# Patient Record
Sex: Female | Born: 1970 | Race: White | Hispanic: No | Smoking: Never smoker
Health system: Southern US, Community
[De-identification: ages and names within clinical notes are randomized; demographics above are authoritative.]

## PROBLEM LIST (undated history)

## (undated) DIAGNOSIS — F419 Anxiety disorder, unspecified: Secondary | ICD-10-CM

## (undated) DIAGNOSIS — M67853 Other specified disorders of tendon, right hip: Secondary | ICD-10-CM

## (undated) DIAGNOSIS — N183 Chronic kidney disease, stage 3 unspecified: Secondary | ICD-10-CM

## (undated) DIAGNOSIS — M1990 Unspecified osteoarthritis, unspecified site: Secondary | ICD-10-CM

## (undated) DIAGNOSIS — F319 Bipolar disorder, unspecified: Secondary | ICD-10-CM

## (undated) DIAGNOSIS — N289 Disorder of kidney and ureter, unspecified: Secondary | ICD-10-CM

## (undated) DIAGNOSIS — F32A Depression, unspecified: Secondary | ICD-10-CM

## (undated) DIAGNOSIS — C801 Malignant (primary) neoplasm, unspecified: Secondary | ICD-10-CM

## (undated) DIAGNOSIS — G4730 Sleep apnea, unspecified: Secondary | ICD-10-CM

## (undated) DIAGNOSIS — M2550 Pain in unspecified joint: Secondary | ICD-10-CM

## (undated) DIAGNOSIS — G43909 Migraine, unspecified, not intractable, without status migrainosus: Secondary | ICD-10-CM

## (undated) DIAGNOSIS — F329 Major depressive disorder, single episode, unspecified: Secondary | ICD-10-CM

## (undated) DIAGNOSIS — K259 Gastric ulcer, unspecified as acute or chronic, without hemorrhage or perforation: Secondary | ICD-10-CM

## (undated) DIAGNOSIS — D649 Anemia, unspecified: Secondary | ICD-10-CM

## (undated) DIAGNOSIS — K829 Disease of gallbladder, unspecified: Secondary | ICD-10-CM

## (undated) DIAGNOSIS — G250 Essential tremor: Secondary | ICD-10-CM

## (undated) DIAGNOSIS — E559 Vitamin D deficiency, unspecified: Secondary | ICD-10-CM

## (undated) DIAGNOSIS — F429 Obsessive-compulsive disorder, unspecified: Secondary | ICD-10-CM

## (undated) DIAGNOSIS — D569 Thalassemia, unspecified: Secondary | ICD-10-CM

## (undated) DIAGNOSIS — M549 Dorsalgia, unspecified: Secondary | ICD-10-CM

## (undated) HISTORY — DX: Anxiety disorder, unspecified: F41.9

## (undated) HISTORY — DX: Disorder of kidney and ureter, unspecified: N28.9

## (undated) HISTORY — DX: Unspecified osteoarthritis, unspecified site: M19.90

## (undated) HISTORY — DX: Gastric ulcer, unspecified as acute or chronic, without hemorrhage or perforation: K25.9

## (undated) HISTORY — DX: Disease of gallbladder, unspecified: K82.9

## (undated) HISTORY — DX: Essential tremor: G25.0

## (undated) HISTORY — DX: Bipolar disorder, unspecified: F31.9

## (undated) HISTORY — DX: Vitamin D deficiency, unspecified: E55.9

## (undated) HISTORY — DX: Pain in unspecified joint: M25.50

## (undated) HISTORY — DX: Dorsalgia, unspecified: M54.9

## (undated) HISTORY — DX: Other specified disorders of tendon, right hip: M67.853

## (undated) HISTORY — PX: KIDNEY SURGERY: SHX687

## (undated) HISTORY — DX: Chronic kidney disease, stage 3 unspecified: N18.30

## (undated) HISTORY — DX: Obsessive-compulsive disorder, unspecified: F42.9

## (undated) HISTORY — DX: Sleep apnea, unspecified: G47.30

## (undated) HISTORY — PX: OTHER SURGICAL HISTORY: SHX169

## (undated) HISTORY — PX: CHOLECYSTECTOMY: SHX55

## (undated) HISTORY — DX: Anemia, unspecified: D64.9

---

## 1999-05-09 ENCOUNTER — Other Ambulatory Visit: Admission: RE | Admit: 1999-05-09 | Discharge: 1999-05-09 | Payer: Self-pay | Admitting: *Deleted

## 2000-10-18 ENCOUNTER — Other Ambulatory Visit: Admission: RE | Admit: 2000-10-18 | Discharge: 2000-10-18 | Payer: Self-pay | Admitting: *Deleted

## 2004-01-06 ENCOUNTER — Other Ambulatory Visit (HOSPITAL_COMMUNITY): Admission: RE | Admit: 2004-01-06 | Discharge: 2004-01-19 | Payer: Self-pay | Admitting: Psychiatry

## 2005-10-06 ENCOUNTER — Emergency Department: Payer: Self-pay | Admitting: General Practice

## 2005-10-09 ENCOUNTER — Emergency Department: Payer: Self-pay | Admitting: Unknown Physician Specialty

## 2005-10-13 ENCOUNTER — Emergency Department: Payer: Self-pay | Admitting: Emergency Medicine

## 2005-10-20 ENCOUNTER — Emergency Department: Payer: Self-pay | Admitting: Emergency Medicine

## 2005-11-03 ENCOUNTER — Emergency Department: Payer: Self-pay | Admitting: Unknown Physician Specialty

## 2007-10-10 ENCOUNTER — Encounter: Admission: RE | Admit: 2007-10-10 | Discharge: 2007-10-10 | Payer: Self-pay | Admitting: Family Medicine

## 2009-10-14 ENCOUNTER — Encounter
Admission: RE | Admit: 2009-10-14 | Discharge: 2009-12-21 | Payer: Self-pay | Admitting: Physical Medicine & Rehabilitation

## 2009-10-17 ENCOUNTER — Ambulatory Visit: Payer: Self-pay | Admitting: Physical Medicine & Rehabilitation

## 2009-11-03 ENCOUNTER — Ambulatory Visit: Payer: Self-pay | Admitting: Physical Medicine & Rehabilitation

## 2012-11-24 ENCOUNTER — Other Ambulatory Visit: Payer: Self-pay | Admitting: Family Medicine

## 2012-11-24 DIAGNOSIS — E079 Disorder of thyroid, unspecified: Secondary | ICD-10-CM

## 2012-11-25 ENCOUNTER — Ambulatory Visit (INDEPENDENT_AMBULATORY_CARE_PROVIDER_SITE_OTHER): Payer: Managed Care, Other (non HMO)

## 2012-11-25 DIAGNOSIS — E079 Disorder of thyroid, unspecified: Secondary | ICD-10-CM

## 2013-01-14 ENCOUNTER — Other Ambulatory Visit: Payer: Self-pay | Admitting: Family Medicine

## 2013-01-14 DIAGNOSIS — M25519 Pain in unspecified shoulder: Secondary | ICD-10-CM

## 2013-01-14 DIAGNOSIS — R52 Pain, unspecified: Secondary | ICD-10-CM

## 2013-01-20 ENCOUNTER — Ambulatory Visit (INDEPENDENT_AMBULATORY_CARE_PROVIDER_SITE_OTHER): Payer: Managed Care, Other (non HMO)

## 2013-01-20 DIAGNOSIS — M25519 Pain in unspecified shoulder: Secondary | ICD-10-CM

## 2014-02-27 ENCOUNTER — Emergency Department
Admission: EM | Admit: 2014-02-27 | Discharge: 2014-02-27 | Disposition: A | Discharge status: Home / Self Care | Payer: Managed Care, Other (non HMO) | Source: Home / Self Care | Attending: Family Medicine | Admitting: Family Medicine

## 2014-02-27 ENCOUNTER — Encounter: Payer: Self-pay | Admitting: Emergency Medicine

## 2014-02-27 DIAGNOSIS — S0990XA Unspecified injury of head, initial encounter: Secondary | ICD-10-CM

## 2014-02-27 DIAGNOSIS — R112 Nausea with vomiting, unspecified: Secondary | ICD-10-CM

## 2014-02-27 DIAGNOSIS — G43909 Migraine, unspecified, not intractable, without status migrainosus: Secondary | ICD-10-CM

## 2014-02-27 HISTORY — DX: Thalassemia, unspecified: D56.9

## 2014-02-27 HISTORY — DX: Malignant (primary) neoplasm, unspecified: C80.1

## 2014-02-27 HISTORY — DX: Depression, unspecified: F32.A

## 2014-02-27 HISTORY — DX: Major depressive disorder, single episode, unspecified: F32.9

## 2014-02-27 HISTORY — DX: Migraine, unspecified, not intractable, without status migrainosus: G43.909

## 2014-02-27 MED ORDER — ONDANSETRON 4 MG PO TBDP: 4.0000 mg | ORAL_TABLET | Freq: Three times a day (TID) | ORAL | Status: AC | PRN

## 2014-02-27 MED ORDER — KETOROLAC TROMETHAMINE 60 MG/2ML IM SOLN
60.0000 mg | Freq: Once | INTRAMUSCULAR | Status: AC
  Administered 2014-02-27: 60 mg via INTRAMUSCULAR

## 2014-02-27 MED ORDER — ONDANSETRON HCL 4 MG/2ML IJ SOLN
4.0000 mg | Freq: Once | INTRAMUSCULAR | Status: AC
  Administered 2014-02-27: 4 mg via INTRAMUSCULAR

## 2014-02-27 MED ORDER — ISOMETHEPTENE-APAP-DICHLORAL 65-325-100 MG PO CAPS: ORAL_CAPSULE | ORAL | Status: DC

## 2014-02-27 NOTE — ED Notes (Signed)
Reports migraine x 5 days; today has also been nauseated and not tolerating food; has had gingerale. No meds for migraine; lost insurance. Yesterday fell against wall while putting on slacks and bumped right side of head; did not lose consciousness.

## 2014-02-27 NOTE — Discharge Instructions (Signed)
Apply ice pack to right head until swelling resolves.   Head Injury, Adult You have received a head injury. It does not appear serious at this time. Headaches and vomiting are common following head injury. It should be easy to awaken from sleeping. Sometimes it is necessary for you to stay in the emergency department for a while for observation. Sometimes admission to the hospital may be needed. After injuries such as yours, most problems occur within the first 24 hours, but side effects may occur up to 7 10 days after the injury. It is important for you to carefully monitor your condition and contact your health care provider or seek immediate medical care if there is a change in your condition. WHAT ARE THE TYPES OF HEAD INJURIES? Head injuries can be as minor as a bump. Some head injuries can be more severe. More severe head injuries include:  A jarring injury to the brain (concussion).  A bruise of the brain (contusion). This mean there is bleeding in the brain that can cause swelling.  A cracked skull (skull fracture).  Bleeding in the brain that collects, clots, and forms a bump (hematoma). WHAT CAUSES A HEAD INJURY? A serious head injury is most likely to happen to someone who is in a car wreck and is not wearing a seat belt. Other causes of major head injuries include bicycle or motorcycle accidents, sports injuries, and falls. HOW ARE HEAD INJURIES DIAGNOSED? A complete history of the event leading to the injury and your current symptoms will be helpful in diagnosing head injuries. Many times, pictures of the brain, such as CT or MRI are needed to see the extent of the injury. Often, an overnight hospital stay is necessary for observation.  WHEN SHOULD I SEEK IMMEDIATE MEDICAL CARE?  You should get help right away if:  You have confusion or drowsiness.  You feel sick to your stomach (nauseous) or have continued, forceful vomiting.  You have dizziness or unsteadiness that is getting  worse.  You have severe, continued headaches not relieved by medicine. Only take over-the-counter or prescription medicines for pain, fever, or discomfort as directed by your health care provider.  You do not have normal function of the arms or legs or are unable to walk.  You notice changes in the black spots in the center of the colored part of your eye (pupil).  You have a clear or bloody fluid coming from your nose or ears.  You have a loss of vision. During the next 24 hours after the injury, you must stay with someone who can watch you for the warning signs. This person should contact local emergency services (911 in the [place] if you have seizures, you become unconscious, or you are unable to wake up. HOW CAN I PREVENT A HEAD INJURY IN THE FUTURE? The most important factor for preventing major head injuries is avoiding motor vehicle accidents. To minimize the potential for damage to your head, it is crucial to wear seat belts while riding in motor vehicles. Wearing helmets while bike riding and playing collision sports (like football) is also helpful. Also, avoiding dangerous activities around the house will further help reduce your risk of head injury.  WHEN CAN I RETURN TO NORMAL ACTIVITIES AND ATHLETICS? You should be reevaluated by your health care provider before returning to these activities. If you have any of the following symptoms, you should not return to activities or contact sports until 1 week after the symptoms have stopped:  Persistent  headache.  Dizziness or vertigo.  Poor attention and concentration.  Confusion.  Memory problems.  Nausea or vomiting.  Fatigue or tire easily.  Irritability.  Intolerant of bright lights or loud noises.  Anxiety or depression.  Disturbed sleep. MAKE SURE YOU:   Understand these instructions.  Will watch your condition.  Will get help right away if you are not doing well or get worse. Document Released: 12/10/2005  Document Revised: 09/30/2013 Document Reviewed: 08/17/2013 [facility] Patient Information 2014 [place].

## 2014-02-27 NOTE — ED Provider Notes (Signed)
CSN: [id number]     Arrival date & time 02/27/14  1327 History   First MD Initiated Contact with Patient 02/27/14 1342     Chief Complaint  Patient presents with  . Migraine  . Nausea      HPI Comments: Patient has a history of migraine headaches that have responded in the past to Midrin.  Five days ago she developed a typical right-sided headache, but no longer has any Midrin.  Her headache has persisted without neurologic symptoms.  Yesterday evening she developed nausea without vomiting.  Also, she reports that she fell yesterday and bumped the right side of her head.  No loss of consciousness, and no increase in her eadache.  Patient is a 43 y.o. female presenting with migraines. The history is provided by the patient.  Migraine Episode onset: 5 days ago. The problem occurs constantly. The problem has not changed since onset.Nothing aggravates the symptoms. Nothing relieves the symptoms. Treatments tried: Excedrin Migraine. The treatment provided no relief.    Past Medical History  Diagnosis Date  . Depression   . Migraine thalassemia  . Thalassemia   . Cancer     kidney   Past Surgical History  Procedure Laterality Date  . Kidney surgery    . Cholecystectomy     Family History  Problem Relation Age of Onset  . Migraines Mother   . Diabetes Father   . Cancer Father   . Migraines Sister    History  Substance Use Topics  . Smoking status: Never Smoker   . Smokeless tobacco: Not on file  . Alcohol Use: No   OB History   Grav Para Term Preterm Abortions TAB SAB Ect Mult Living                 Review of Systems  Constitutional: Positive for activity change. Negative for fever and chills.  Eyes: Negative for photophobia.  Neurological: Negative for dizziness, tremors, syncope, facial asymmetry, speech difficulty, weakness, light-headedness and numbness.    Allergies  Review of patient's allergies indicates no known allergies.  Home Medications   Current  Outpatient Rx  Name  Route  Sig  Dispense  Refill  . clonazePAM (KLONOPIN) 2 MG tablet   Oral   Take 2 mg by mouth at bedtime.         . lamoTRIgine (LAMICTAL) 100 MG tablet   Oral   Take 100 mg by mouth daily.         Marland Kitchen lithium carbonate (ESKALITH) 450 MG CR tablet   Oral   Take 900 mg by mouth every morning.         . sertraline (ZOLOFT) 100 MG tablet   Oral   Take 100 mg by mouth daily.         Marland Kitchen topiramate (TOPAMAX) 100 MG tablet   Oral   Take 400 mg by mouth daily.         Marland Kitchen isometheptene-acetaminophen-dichloralphenazone (MIDRIN) 65-325-100 MG capsule      Take 2 caps PO at first sign of headache, then 1 Q4hr prn. Maximum 5 capsules in 12 hours for migraine headaches   30 capsule   1   . ondansetron (ZOFRAN ODT) 4 MG disintegrating tablet   Oral   Take 1 tablet (4 mg total) by mouth every 8 (eight) hours as needed for nausea or vomiting.   20 tablet   0    BP 104/70  Pulse 75  Temp(Src) 97.4 F (36.3 C) (Oral)  Resp 16  Ht 5\' 5"  (1.651 m)  Wt 180 lb (81.647 kg)  BMI 29.95 kg/m2  SpO2 100%  LMP 02/16/2014 Physical Exam  Nursing note and vitals reviewed. Constitutional: She is oriented to person, place, and time. She appears well-developed and well-nourished. No distress.  HENT:  Head: Normocephalic.    Right Ear: External ear normal.  Left Ear: External ear normal.  Nose: Nose normal.  Mouth/Throat: Oropharynx is clear and moist.  Right parietal area has a 4cm dia area of tenderness without hematoma as noted on diagam  Eyes: Conjunctivae and EOM are normal. Pupils are equal, round, and reactive to light.  Neck: Normal range of motion. Neck supple.  Cardiovascular: Normal heart sounds.   Pulmonary/Chest: Breath sounds normal.  Abdominal: Bowel sounds are normal. There is no tenderness.  Lymphadenopathy:    She has no cervical adenopathy.  Neurological: She is alert and oriented to person, place, and time. She has normal strength and normal  reflexes. No cranial nerve deficit or sensory deficit. Coordination and gait normal.  Skin: Skin is warm and dry.    ED Course  Procedures  none      MDM   1. Migraine headache   2. Nausea with vomiting   3. Head injury, unspecified    Administered Toradol 60mg  IM and Zofran 4mg  IM.  Rx for Zofran. Apply ice pack to right head until swelling resolves. Discussed head injury precautions If symptoms become significantly worse during the night or over the weekend, proceed to the local emergency room.     Kandra Nicolas, MD 03/01/14 [phone]

## 2014-09-11 ENCOUNTER — Emergency Department
Admission: EM | Admit: 2014-09-11 | Discharge: 2014-09-11 | Disposition: A | Discharge status: Home / Self Care | Payer: 59 | Source: Home / Self Care | Attending: Emergency Medicine | Admitting: Emergency Medicine

## 2014-09-11 ENCOUNTER — Encounter: Payer: Self-pay | Admitting: Emergency Medicine

## 2014-09-11 DIAGNOSIS — Z23 Encounter for immunization: Secondary | ICD-10-CM

## 2014-09-11 DIAGNOSIS — T22219A Burn of second degree of unspecified forearm, initial encounter: Secondary | ICD-10-CM

## 2014-09-11 DIAGNOSIS — T22211A Burn of second degree of right forearm, initial encounter: Secondary | ICD-10-CM

## 2014-09-11 MED ORDER — KETOROLAC TROMETHAMINE 60 MG/2ML IM SOLN
60.0000 mg | Freq: Once | INTRAMUSCULAR | Status: AC
  Administered 2014-09-11: 60 mg via INTRAMUSCULAR

## 2014-09-11 MED ORDER — TETANUS-DIPHTH-ACELL PERTUSSIS 5-2.5-18.5 LF-MCG/0.5 IM SUSP
0.5000 mL | Freq: Once | INTRAMUSCULAR | Status: AC
  Administered 2014-09-11: 0.5 mL via INTRAMUSCULAR

## 2014-09-11 MED ORDER — HYDROCODONE-ACETAMINOPHEN 5-325 MG PO TABS: 1.0000 | ORAL_TABLET | ORAL | Status: DC | PRN
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
## 2014-09-11 NOTE — ED Notes (Signed)
Site cleaned with Hibiclens, applied bacitracin ointment and non-stick bandage.

## 2014-09-11 NOTE — Discharge Instructions (Signed)
Burn Care Your skin is a natural barrier to infection. It is the largest organ of your body. Burns damage this natural protection. To help prevent infection, it is very important to follow your caregiver's instructions in the care of your burn. Burns are classified as:  First degree. There is only redness of the skin (erythema). No scarring is expected.  Second degree. There is blistering of the skin. Scarring may occur with deeper burns.  Third degree. All layers of the skin are injured, and scarring is expected. HOME CARE INSTRUCTIONS   Wash your hands well before changing your bandage.  Change your bandage as often as directed by your caregiver. Daily. Leave open to air after 5 days if site is improving.   Remove the old bandage. If the bandage sticks, you may soak it off with cool, clean water.  Cleanse the burn thoroughly but gently with mild soap and water.  Pat the area dry with a clean, dry cloth.  Apply a thin layer of antibacterial cream to the burn.  Apply a clean bandage as instructed by your caregiver.  Keep the bandage as clean and dry as possible.  Elevate the affected area for the first 24 hours, then as instructed by your caregiver.  Only take over-the-counter or prescription medicines for pain, discomfort, or fever as directed by your caregiver. SEEK IMMEDIATE MEDICAL CARE IF:   You develop excessive pain.  You develop redness, tenderness, swelling, or red streaks near the burn.  The burned area develops yellowish-white fluid (pus) or a bad smell.  You have a fever. MAKE SURE YOU:   Understand these instructions.  Will watch your condition.  Will get help right away if you are not doing well or get worse. Document Released: 12/10/2005 Document Revised: 03/03/2012 Document Reviewed: 05/02/2011 [facility] Patient Information 2015 [place], [place]. This information is not intended to replace advice given to you by your health care provider. Make sure you  discuss any questions you have with your health care provider.
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
## 2014-12-24 HISTORY — PX: ABDOMINAL HYSTERECTOMY: SHX81

## 2016-01-16 ENCOUNTER — Emergency Department
Admission: EM | Admit: 2016-01-16 | Discharge: 2016-01-16 | Disposition: A | Discharge status: Home / Self Care | Payer: Medicare Other | Attending: Emergency Medicine | Admitting: Emergency Medicine

## 2016-01-16 ENCOUNTER — Encounter: Payer: Self-pay | Admitting: Emergency Medicine

## 2016-01-16 DIAGNOSIS — Z79899 Other long term (current) drug therapy: Secondary | ICD-10-CM | POA: Diagnosis not present

## 2016-01-16 DIAGNOSIS — K529 Noninfective gastroenteritis and colitis, unspecified: Secondary | ICD-10-CM | POA: Insufficient documentation

## 2016-01-16 DIAGNOSIS — E860 Dehydration: Secondary | ICD-10-CM | POA: Diagnosis not present

## 2016-01-16 DIAGNOSIS — F419 Anxiety disorder, unspecified: Secondary | ICD-10-CM | POA: Insufficient documentation

## 2016-01-16 DIAGNOSIS — E876 Hypokalemia: Secondary | ICD-10-CM | POA: Diagnosis not present

## 2016-01-16 DIAGNOSIS — R112 Nausea with vomiting, unspecified: Secondary | ICD-10-CM | POA: Diagnosis present

## 2016-01-16 DIAGNOSIS — R51 Headache: Secondary | ICD-10-CM | POA: Diagnosis not present

## 2016-01-16 LAB — COMPREHENSIVE METABOLIC PANEL
ALBUMIN: 3.7 g/dL (ref 3.5–5.0)
ALT: 24 U/L (ref 14–54)
ANION GAP: 9 (ref 5–15)
AST: 18 U/L (ref 15–41)
Alkaline Phosphatase: 140 U/L — ABNORMAL HIGH (ref 38–126)
BUN: 19 mg/dL (ref 6–20)
CHLORIDE: 105 mmol/L (ref 101–111)
CO2: 21 mmol/L — AB (ref 22–32)
Calcium: 8.9 mg/dL (ref 8.9–10.3)
Creatinine, Ser: 1.54 mg/dL — ABNORMAL HIGH (ref 0.44–1.00)
GFR calc Af Amer: 46 mL/min — ABNORMAL LOW (ref >60)
GFR calc non Af Amer: 40 mL/min — ABNORMAL LOW (ref >60)
GLUCOSE: 94 mg/dL (ref 65–99)
POTASSIUM: 2.6 mmol/L — AB (ref 3.5–5.1)
SODIUM: 135 mmol/L (ref 135–145)
Total Bilirubin: 0.3 mg/dL (ref 0.3–1.2)
Total Protein: 7.2 g/dL (ref 6.5–8.1)

## 2016-01-16 LAB — CBC
HEMATOCRIT: 38.4 % (ref 35.0–47.0)
Hemoglobin: 11.9 g/dL — ABNORMAL LOW (ref 12.0–16.0)
MCH: 18.6 pg — ABNORMAL LOW (ref 26.0–34.0)
MCHC: 30.9 g/dL — ABNORMAL LOW (ref 32.0–36.0)
MCV: 60.3 fL — AB (ref 80.0–100.0)
Platelets: 195 10*3/uL (ref 150–440)
RBC: 6.38 MIL/uL — ABNORMAL HIGH (ref 3.80–5.20)
RDW: 18.8 % — AB (ref 11.5–14.5)
WBC: 8 10*3/uL (ref 3.6–11.0)

## 2016-01-16 LAB — URINALYSIS COMPLETE WITH MICROSCOPIC (ARMC ONLY)
BACTERIA UA: NONE SEEN
BILIRUBIN URINE: NEGATIVE
Glucose, UA: NEGATIVE mg/dL
Ketones, ur: NEGATIVE mg/dL
LEUKOCYTES UA: NEGATIVE
Nitrite: NEGATIVE
PH: 6 (ref 5.0–8.0)
PROTEIN: NEGATIVE mg/dL
Specific Gravity, Urine: 1.002 — ABNORMAL LOW (ref 1.005–1.030)

## 2016-01-16 LAB — POTASSIUM: POTASSIUM: 2.6 mmol/L — AB (ref 3.5–5.1)

## 2016-01-16 LAB — LIPASE, BLOOD: Lipase: 21 U/L (ref 11–51)

## 2016-01-16 LAB — TROPONIN I

## 2016-01-16 MED ORDER — ONDANSETRON HCL 4 MG/2ML IJ SOLN
4.0000 mg | Freq: Once | INTRAMUSCULAR | Status: AC
  Administered 2016-01-16: 4 mg via INTRAVENOUS
  Filled 2016-01-16: qty 2

## 2016-01-16 MED ORDER — PROMETHAZINE HCL 12.5 MG PO TABS: 12.5000 mg | ORAL_TABLET | Freq: Four times a day (QID) | ORAL | Status: DC | PRN

## 2016-01-16 MED ORDER — POTASSIUM CHLORIDE 10 MEQ/100ML IV SOLN
10.0000 meq | Freq: Once | INTRAVENOUS | Status: AC
  Administered 2016-01-16: 10 meq via INTRAVENOUS
  Filled 2016-01-16: qty 100

## 2016-01-16 MED ORDER — SODIUM CHLORIDE 0.9 % IV SOLN
1000.0000 mL | Freq: Once | INTRAVENOUS | Status: AC
  Administered 2016-01-16: 1000 mL via INTRAVENOUS

## 2016-01-16 MED ORDER — POTASSIUM CHLORIDE CRYS ER 20 MEQ PO TBCR
40.0000 meq | EXTENDED_RELEASE_TABLET | ORAL | Status: AC
  Administered 2016-01-16: 40 meq via ORAL

## 2016-01-16 MED ORDER — POTASSIUM CHLORIDE CRYS ER 20 MEQ PO TBCR
40.0000 meq | EXTENDED_RELEASE_TABLET | Freq: Two times a day (BID) | ORAL | Status: DC
  Filled 2016-01-16: qty 2

## 2016-01-16 NOTE — Discharge Instructions (Signed)

## 2016-01-16 NOTE — ED Notes (Signed)
Pt a/o. Hr 89 on recheck. All other vs wnl. Bilateral abd pain. Nausea.

## 2016-01-16 NOTE — ED Notes (Signed)
Lab called with potassium of 2.6  Dr Corky Downs aware.

## 2016-01-16 NOTE — ED Notes (Signed)
Pt reports upper abdominal pain, mid back pain, n/v/d since Saturday.

## 2016-01-16 NOTE — ED Provider Notes (Signed)
[facility] Emergency Department Provider Note  ____________________________________________    I have reviewed the triage vital signs and the nursing notes.   HISTORY  Chief Complaint Abnormal Lab; Abdominal Pain; and Back Pain    HPI Natasha Kim is a 45 y.o. female who presents with nausea vomiting diarrhea for approximately 2 days. She does complain of mild to moderate diffuse abdominal cramping as well particularly when vomiting. She went to urgent care today and was referred to the ED for elevated creatinine and orthostatics and the need for IV fluids. She denies fevers but has had chills. No sick contacts. No recent travel.     Past Medical History  Diagnosis Date  . Depression   . Migraine thalassemia  . Thalassemia   . Cancer ([place])     kidney    There are no active problems to display for this patient.   Past Surgical History  Procedure Laterality Date  . Kidney surgery    . Cholecystectomy    . Partial kidney resection-left    . Abdominal hysterectomy  2016    Current Outpatient Rx  Name  Route  Sig  Dispense  Refill  . clonazePAM (KLONOPIN) 2 MG tablet   Oral   Take 2 mg by mouth at bedtime.         Marland Kitchen HYDROcodone-acetaminophen (NORCO/VICODIN) 5-325 MG per tablet   Oral   Take 1-2 tablets by mouth every 4 (four) hours as needed for severe pain. Take with food.   12 tablet   0   . isometheptene-acetaminophen-dichloralphenazone (MIDRIN) 65-325-100 MG capsule      Take 2 caps PO at first sign of headache, then 1 Q4hr prn. Maximum 5 capsules in 12 hours for migraine headaches   30 capsule   1   . lamoTRIgine (LAMICTAL) 100 MG tablet   Oral   Take 100 mg by mouth daily.         Marland Kitchen lithium carbonate (ESKALITH) 450 MG CR tablet   Oral   Take 900 mg by mouth every morning.         . ondansetron (ZOFRAN ODT) 4 MG disintegrating tablet   Oral   Take 1 tablet (4 mg total) by mouth every 8 (eight) hours as needed for  nausea or vomiting.   20 tablet   0   . sertraline (ZOLOFT) 100 MG tablet   Oral   Take 100 mg by mouth daily.         Marland Kitchen topiramate (TOPAMAX) 100 MG tablet   Oral   Take 400 mg by mouth daily.           Allergies Review of patient's allergies indicates no known allergies.  Family History  Problem Relation Age of Onset  . Migraines Mother   . Diabetes Father   . Cancer Father   . Migraines Sister     Social History Social History  Substance Use Topics  . Smoking status: Never Smoker   . Smokeless tobacco: Never Used  . Alcohol Use: No    Review of Systems  Constitutional: Negative for fever. Positive for chills Eyes: Negative for visual changes. ENT: Negative for sore throat Cardiovascular: Negative for chest pain. Respiratory: Negative for shortness of breath. Gastrointestinal: As above Genitourinary: Negative for dysuria. Musculoskeletal: Negative for back pain. Skin: Negative for rash. Neurological: Positive for mild headache Psychiatric: Positive for anxiety    ____________________________________________   PHYSICAL EXAM:  VITAL SIGNS: ED Triage Vitals  Enc Vitals  Group     BP 01/16/16 1653 118/89 mmHg     Pulse Rate 01/16/16 1653 110     Resp 01/16/16 1653 16     Temp 01/16/16 1653 98.7 F (37.1 C)     Temp Source 01/16/16 1653 Oral     SpO2 01/16/16 1653 100 %     Weight 01/16/16 1653 210 lb (95.255 kg)     Height 01/16/16 1653 5\' 5"  (1.651 m)     Head Cir --      Peak Flow --      Pain Score 01/16/16 1653 8     Pain Loc --      Pain Edu? --      Excl. in [place]? --      Constitutional: Alert and oriented. Well appearing and in no distress. Eyes: Conjunctivae are normal.  ENT   Head: Normocephalic and atraumatic.   Mouth/Throat: Mucous membranes are moist. Cardiovascular: Normal rate, regular rhythm. Normal and symmetric distal pulses are present in all extremities. No murmurs, rubs, or gallops. Respiratory: Normal  respiratory effort without tachypnea nor retractions. Breath sounds are clear and equal bilaterally.  Gastrointestinal: Soft and non-tender in all quadrants. No distention. There is no CVA tenderness. Genitourinary: deferred Musculoskeletal: Nontender with normal range of motion in all extremities. No lower extremity tenderness nor edema. Neurologic:  Normal speech and language. No gross focal neurologic deficits are appreciated. Skin:  Skin is warm, dry and intact. No rash noted. Psychiatric: Mood and affect are normal. Patient exhibits appropriate insight and judgment.  ____________________________________________    LABS (pertinent positives/negatives)  Labs Reviewed  COMPREHENSIVE METABOLIC PANEL - Abnormal; Notable for the following:    Potassium 2.6 (*)    CO2 21 (*)    Creatinine, Ser 1.54 (*)    Alkaline Phosphatase 140 (*)    GFR calc non Af Amer 40 (*)    GFR calc Af Amer 46 (*)    All other components within normal limits  CBC - Abnormal; Notable for the following:    RBC 6.38 (*)    Hemoglobin 11.9 (*)    MCV 60.3 (*)    MCH 18.6 (*)    MCHC 30.9 (*)    RDW 18.8 (*)    All other components within normal limits  URINALYSIS COMPLETEWITH MICROSCOPIC ([facility] ONLY) - Abnormal; Notable for the following:    Color, Urine STRAW (*)    APPearance CLEAR (*)    Specific Gravity, Urine 1.002 (*)    Hgb urine dipstick 1+ (*)    Squamous Epithelial / LPF 6-30 (*)    All other components within normal limits  LIPASE, BLOOD  TROPONIN I  POTASSIUM    ____________________________________________   EKG  ED ECG REPORT I, Lavonia Drafts, the attending physician, personally viewed and interpreted this ECG.  Date: 01/16/2016 EKG Time: 4:55 PM Rate: 99 Rhythm: normal sinus rhythm QRS Axis: normal Intervals: normal ST/T Wave abnormalities: Nonspecific Conduction Disutrbances: none    ____________________________________________    RADIOLOGY I have personally  reviewed any xrays that were ordered on this patient: None  ____________________________________________   PROCEDURES  Procedure(s) performed: none  Critical Care performed: none  ____________________________________________   INITIAL IMPRESSION / ASSESSMENT AND PLAN / ED COURSE  Pertinent labs & imaging results that were available during my care of the patient were reviewed by me and considered in my medical decision making (see chart for details).  Patient presents with nausea vomiting and diarrhea likely related to the  viral gastroenteritis that is rampant in the community at this time. We will give IV fluids and IV Zofran and reevaluate   Patient's potassium is noted to be at 2.6 here. Her potassium was 3.1 only a few hours ago at the outside facility. We will recheck. Recheck potassium is 2.6. Gave 10 of IV potassium and 40 mEq by mouth. Patient has had 2 L of normal saline in the emergency department.  She feels significantly better. I offered admission but the patient strongly prefers to go home. I think this is reasonable given she has ability to have her potassium rechecked by her physician tomorrow   ____________________________________________   FINAL CLINICAL IMPRESSION(S) / ED DIAGNOSES  Final diagnoses:  Gastroenteritis  Dehydration  Hypokalemia     Lavonia Drafts, MD 01/16/16 2132

## 2017-05-01 ENCOUNTER — Emergency Department: Payer: Medicare Other

## 2017-05-01 ENCOUNTER — Observation Stay
Admission: EM | Admit: 2017-05-01 | Discharge: 2017-05-03 | Disposition: A | Discharge status: Home / Self Care | Payer: Medicare Other | Attending: Specialist | Admitting: Specialist

## 2017-05-01 ENCOUNTER — Encounter: Payer: Self-pay | Admitting: *Deleted

## 2017-05-01 DIAGNOSIS — Z85528 Personal history of other malignant neoplasm of kidney: Secondary | ICD-10-CM | POA: Insufficient documentation

## 2017-05-01 DIAGNOSIS — A084 Viral intestinal infection, unspecified: Principal | ICD-10-CM | POA: Insufficient documentation

## 2017-05-01 DIAGNOSIS — D569 Thalassemia, unspecified: Secondary | ICD-10-CM | POA: Insufficient documentation

## 2017-05-01 DIAGNOSIS — E872 Acidosis: Secondary | ICD-10-CM | POA: Diagnosis not present

## 2017-05-01 DIAGNOSIS — Z79899 Other long term (current) drug therapy: Secondary | ICD-10-CM | POA: Insufficient documentation

## 2017-05-01 DIAGNOSIS — D509 Iron deficiency anemia, unspecified: Secondary | ICD-10-CM | POA: Insufficient documentation

## 2017-05-01 DIAGNOSIS — R112 Nausea with vomiting, unspecified: Secondary | ICD-10-CM | POA: Diagnosis present

## 2017-05-01 DIAGNOSIS — R1084 Generalized abdominal pain: Secondary | ICD-10-CM

## 2017-05-01 DIAGNOSIS — F329 Major depressive disorder, single episode, unspecified: Secondary | ICD-10-CM | POA: Diagnosis not present

## 2017-05-01 DIAGNOSIS — Z79890 Hormone replacement therapy (postmenopausal): Secondary | ICD-10-CM | POA: Insufficient documentation

## 2017-05-01 DIAGNOSIS — G43909 Migraine, unspecified, not intractable, without status migrainosus: Secondary | ICD-10-CM | POA: Diagnosis not present

## 2017-05-01 DIAGNOSIS — K625 Hemorrhage of anus and rectum: Secondary | ICD-10-CM | POA: Diagnosis not present

## 2017-05-01 LAB — URINALYSIS, COMPLETE (UACMP) WITH MICROSCOPIC
BILIRUBIN URINE: NEGATIVE
Bacteria, UA: NONE SEEN
GLUCOSE, UA: NEGATIVE mg/dL
KETONES UR: NEGATIVE mg/dL
LEUKOCYTES UA: NEGATIVE
NITRITE: NEGATIVE
PH: 7 (ref 5.0–8.0)
Protein, ur: NEGATIVE mg/dL
Specific Gravity, Urine: 1.004 — ABNORMAL LOW (ref 1.005–1.030)
WBC, UA: NONE SEEN WBC/hpf (ref 0–5)

## 2017-05-01 LAB — COMPREHENSIVE METABOLIC PANEL
ALK PHOS: 131 U/L — AB (ref 38–126)
ALT: 30 U/L (ref 14–54)
AST: 26 U/L (ref 15–41)
Albumin: 4.1 g/dL (ref 3.5–5.0)
Anion gap: 8 (ref 5–15)
BUN: 11 mg/dL (ref 6–20)
CALCIUM: 9.1 mg/dL (ref 8.9–10.3)
CO2: 18 mmol/L — ABNORMAL LOW (ref 22–32)
CREATININE: 1.11 mg/dL — AB (ref 0.44–1.00)
Chloride: 115 mmol/L — ABNORMAL HIGH (ref 101–111)
GFR, EST NON AFRICAN AMERICAN: 59 mL/min — AB (ref >60)
Glucose, Bld: 150 mg/dL — ABNORMAL HIGH (ref 65–99)
Potassium: 3.7 mmol/L (ref 3.5–5.1)
Sodium: 141 mmol/L (ref 135–145)
TOTAL PROTEIN: 7.8 g/dL (ref 6.5–8.1)
Total Bilirubin: 0.5 mg/dL (ref 0.3–1.2)

## 2017-05-01 LAB — CBC
HCT: 35.7 % (ref 35.0–47.0)
HCT: 33.2 % — ABNORMAL LOW (ref 35.0–47.0)
Hemoglobin: 11 g/dL — ABNORMAL LOW (ref 12.0–16.0)
Hemoglobin: 10.3 g/dL — ABNORMAL LOW (ref 12.0–16.0)
MCH: 18.1 pg — ABNORMAL LOW (ref 26.0–34.0)
MCH: 18.2 pg — AB (ref 26.0–34.0)
MCHC: 30.7 g/dL — ABNORMAL LOW (ref 32.0–36.0)
MCHC: 30.9 g/dL — ABNORMAL LOW (ref 32.0–36.0)
MCV: 58.8 fL — ABNORMAL LOW (ref 80.0–100.0)
MCV: 58.9 fL — ABNORMAL LOW (ref 80.0–100.0)
PLATELETS: 293 10*3/uL (ref 150–440)
Platelets: 332 10*3/uL (ref 150–440)
RBC: 6.07 MIL/uL — AB (ref 3.80–5.20)
RBC: 5.63 MIL/uL — AB (ref 3.80–5.20)
RDW: 19.1 % — ABNORMAL HIGH (ref 11.5–14.5)
RDW: 18.9 % — ABNORMAL HIGH (ref 11.5–14.5)
WBC: 24.1 10*3/uL — AB (ref 3.6–11.0)
WBC: 21.7 10*3/uL — ABNORMAL HIGH (ref 3.6–11.0)

## 2017-05-01 LAB — CBC WITH DIFFERENTIAL/PLATELET
Basophils Absolute: 0 10*3/uL (ref 0–0.1)
Basophils Relative: 0 %
Eosinophils Absolute: 0 10*3/uL (ref 0–0.7)
Eosinophils Relative: 0 %
HEMATOCRIT: 33.2 % — AB (ref 35.0–47.0)
HEMOGLOBIN: 10.2 g/dL — AB (ref 12.0–16.0)
Lymphocytes Relative: 10 %
Lymphs Abs: 2 10*3/uL (ref 1.0–3.6)
MCH: 18.1 pg — ABNORMAL LOW (ref 26.0–34.0)
MCHC: 30.7 g/dL — ABNORMAL LOW (ref 32.0–36.0)
MCV: 58.9 fL — AB (ref 80.0–100.0)
MONO ABS: 1.3 10*3/uL — AB (ref 0.2–0.9)
MONOS PCT: 7 %
NEUTROS ABS: 16.7 10*3/uL — AB (ref 1.4–6.5)
Neutrophils Relative %: 83 %
Platelets: 279 10*3/uL (ref 150–440)
RBC: 5.65 MIL/uL — ABNORMAL HIGH (ref 3.80–5.20)
RDW: 19.2 % — AB (ref 11.5–14.5)
WBC: 20 10*3/uL — ABNORMAL HIGH (ref 3.6–11.0)

## 2017-05-01 LAB — FERRITIN: FERRITIN: 8 ng/mL — AB (ref 11–307)

## 2017-05-01 LAB — TSH: TSH: 3.221 u[IU]/mL (ref 0.350–4.500)

## 2017-05-01 LAB — LIPASE, BLOOD: Lipase: 19 U/L (ref 11–51)

## 2017-05-01 LAB — CREATININE, SERUM
CREATININE: 1.14 mg/dL — AB (ref 0.44–1.00)
GFR calc Af Amer: >60 mL/min (ref >60)
GFR calc non Af Amer: 57 mL/min — ABNORMAL LOW (ref >60)

## 2017-05-01 LAB — IRON AND TIBC
Iron: 52 ug/dL (ref 28–170)
Saturation Ratios: 11 % (ref 10.4–31.8)
TIBC: 465 ug/dL — ABNORMAL HIGH (ref 250–450)
UIBC: 413 ug/dL

## 2017-05-01 LAB — FOLATE: FOLATE: 12 ng/mL (ref >5.9)

## 2017-05-01 LAB — VITAMIN B12: VITAMIN B 12: 585 pg/mL (ref 180–914)

## 2017-05-01 LAB — LITHIUM LEVEL: Lithium Lvl: 0.21 mmol/L — ABNORMAL LOW (ref 0.60–1.20)

## 2017-05-01 MED ORDER — MORPHINE SULFATE (PF) 4 MG/ML IV SOLN
4.0000 mg | Freq: Once | INTRAVENOUS | Status: AC
  Administered 2017-05-01: 4 mg via INTRAVENOUS
  Filled 2017-05-01: qty 1

## 2017-05-01 MED ORDER — ZOLPIDEM TARTRATE 5 MG PO TABS
5.0000 mg | ORAL_TABLET | Freq: Every day | ORAL | Status: DC
  Administered 2017-05-01 – 2017-05-02 (×2): 5 mg via ORAL
  Filled 2017-05-01 (×2): qty 1

## 2017-05-01 MED ORDER — ONDANSETRON 4 MG PO TBDP
4.0000 mg | ORAL_TABLET | Freq: Once | ORAL | Status: AC | PRN
  Administered 2017-05-01: 4 mg via ORAL

## 2017-05-01 MED ORDER — LITHIUM CARBONATE ER 450 MG PO TBCR
900.0000 mg | EXTENDED_RELEASE_TABLET | Freq: Every morning | ORAL | Status: DC
  Filled 2017-05-01: qty 2

## 2017-05-01 MED ORDER — LITHIUM CARBONATE ER 300 MG PO TBCR
600.0000 mg | EXTENDED_RELEASE_TABLET | Freq: Every evening | ORAL | Status: DC
  Administered 2017-05-01 – 2017-05-02 (×2): 600 mg via ORAL
  Filled 2017-05-01 (×3): qty 2

## 2017-05-01 MED ORDER — ENOXAPARIN SODIUM 40 MG/0.4ML ~~LOC~~ SOLN
40.0000 mg | SUBCUTANEOUS | Status: DC
  Administered 2017-05-01 – 2017-05-02 (×2): 40 mg via SUBCUTANEOUS
  Filled 2017-05-01 (×2): qty 0.4

## 2017-05-01 MED ORDER — ACETAMINOPHEN 650 MG RE SUPP: 650.0000 mg | Freq: Four times a day (QID) | RECTAL | Status: DC | PRN

## 2017-05-01 MED ORDER — ONDANSETRON 4 MG PO TBDP
ORAL_TABLET | ORAL | Status: AC
  Filled 2017-05-01: qty 1

## 2017-05-01 MED ORDER — LAMOTRIGINE 100 MG PO TABS
200.0000 mg | ORAL_TABLET | Freq: Every day | ORAL | Status: DC
  Administered 2017-05-01 – 2017-05-03 (×3): 200 mg via ORAL
  Filled 2017-05-01 (×3): qty 2

## 2017-05-01 MED ORDER — DICYCLOMINE HCL 20 MG PO TABS
20.0000 mg | ORAL_TABLET | Freq: Once | ORAL | Status: AC
  Administered 2017-05-01: 20 mg via ORAL
  Filled 2017-05-01: qty 1

## 2017-05-01 MED ORDER — PROMETHAZINE HCL 25 MG PO TABS
12.5000 mg | ORAL_TABLET | Freq: Four times a day (QID) | ORAL | Status: DC | PRN
  Administered 2017-05-01: 12.5 mg via ORAL
  Filled 2017-05-01: qty 1

## 2017-05-01 MED ORDER — ONDANSETRON 4 MG PO TBDP
4.0000 mg | ORAL_TABLET | Freq: Three times a day (TID) | ORAL | Status: DC | PRN
  Administered 2017-05-01: 4 mg via ORAL
  Filled 2017-05-01: qty 1

## 2017-05-01 MED ORDER — HYDROCODONE-ACETAMINOPHEN 5-325 MG PO TABS
1.0000 | ORAL_TABLET | ORAL | Status: DC | PRN
  Administered 2017-05-01 – 2017-05-03 (×9): 2 via ORAL
  Filled 2017-05-01 (×9): qty 2

## 2017-05-01 MED ORDER — SODIUM CHLORIDE 0.9 % IV SOLN
Freq: Once | INTRAVENOUS | Status: AC
  Administered 2017-05-01: 11:00:00 via INTRAVENOUS

## 2017-05-01 MED ORDER — SODIUM CHLORIDE 0.9 % IV SOLN
INTRAVENOUS | Status: DC
  Administered 2017-05-01 – 2017-05-03 (×3): via INTRAVENOUS

## 2017-05-01 MED ORDER — TOPIRAMATE 100 MG PO TABS
400.0000 mg | ORAL_TABLET | Freq: Every day | ORAL | Status: DC
  Administered 2017-05-01 – 2017-05-03 (×3): 400 mg via ORAL
  Filled 2017-05-01 (×3): qty 4

## 2017-05-01 MED ORDER — CLONAZEPAM 1 MG PO TABS
2.0000 mg | ORAL_TABLET | Freq: Every day | ORAL | Status: DC
  Administered 2017-05-01 – 2017-05-02 (×2): 2 mg via ORAL
  Filled 2017-05-01 (×2): qty 2

## 2017-05-01 MED ORDER — CARIPRAZINE HCL 1.5 MG PO CAPS
1.5000 mg | ORAL_CAPSULE | Freq: Every day | ORAL | Status: DC
  Administered 2017-05-02 (×2): 1.5 mg via ORAL
  Filled 2017-05-01 (×2): qty 1

## 2017-05-01 MED ORDER — HYDROCODONE-ACETAMINOPHEN 5-325 MG PO TABS
ORAL_TABLET | ORAL | Status: AC
  Administered 2017-05-01: 2 via ORAL
  Filled 2017-05-01: qty 2

## 2017-05-01 MED ORDER — ONDANSETRON HCL 4 MG PO TABS
4.0000 mg | ORAL_TABLET | Freq: Four times a day (QID) | ORAL | Status: DC | PRN
  Filled 2017-05-01: qty 1

## 2017-05-01 MED ORDER — ACETAMINOPHEN 325 MG PO TABS: 650.0000 mg | ORAL_TABLET | Freq: Four times a day (QID) | ORAL | Status: DC | PRN

## 2017-05-01 MED ORDER — PANTOPRAZOLE SODIUM 40 MG IV SOLR
40.0000 mg | Freq: Two times a day (BID) | INTRAVENOUS | Status: DC
  Administered 2017-05-01 – 2017-05-02 (×3): 40 mg via INTRAVENOUS
  Filled 2017-05-01 (×3): qty 40

## 2017-05-01 MED ORDER — ONDANSETRON HCL 4 MG/2ML IJ SOLN: 4.0000 mg | Freq: Four times a day (QID) | INTRAMUSCULAR | Status: DC | PRN

## 2017-05-01 MED ORDER — IOPAMIDOL (ISOVUE-300) INJECTION 61%
100.0000 mL | Freq: Once | INTRAVENOUS | Status: AC | PRN
  Administered 2017-05-01: 100 mL via INTRAVENOUS

## 2017-05-01 MED ORDER — SERTRALINE HCL 100 MG PO TABS
100.0000 mg | ORAL_TABLET | Freq: Every day | ORAL | Status: DC
  Administered 2017-05-01 – 2017-05-03 (×3): 100 mg via ORAL
  Filled 2017-05-01 (×3): qty 1

## 2017-05-01 MED ORDER — ONDANSETRON HCL 4 MG/2ML IJ SOLN
4.0000 mg | Freq: Once | INTRAMUSCULAR | Status: AC
  Administered 2017-05-01: 4 mg via INTRAVENOUS
  Filled 2017-05-01: qty 2

## 2017-05-01 NOTE — Progress Notes (Signed)
Dr. Ara Kussmaul notified of patient's proper dose of Lithium Carbonate as at home (600 mg vs. 900 mg); also that patient's family brought in her home med of Vraylar 1.5 mg that she states she takes every bedtime for Bipolar; Acknowledged; new orders written. Barbaraann Faster, RN 11:20 PM 05/01/2017

## 2017-05-01 NOTE — ED Notes (Signed)
Patient transported to CT 

## 2017-05-01 NOTE — ED Triage Notes (Signed)
States nausea that began last night with 6 episodes of vomiting and lower abd pain, denies any problems with urination, awake and alert in no acute distress

## 2017-05-01 NOTE — ED Provider Notes (Signed)
[facility] Emergency Department Provider Note       Time seen: ----------------------------------------- 10:33 AM on 05/01/2017 -----------------------------------------     I have reviewed the triage vital signs and the nursing notes.   HISTORY   Chief Complaint Emesis    HPI Natasha Kim is a 46 y.o. female who presents to the ED for nausea that began last night with 6 episodes of vomiting and lower abdominal pain diffusely. She is also having low back pain. She denies any trouble urinating. She denies fevers, chills or other complaints. This is not been a frequent problem in the past.    Past Medical History:  Diagnosis Date  . Cancer ([place])    kidney  . Depression   . Migraine thalassemia  . Thalassemia     There are no active problems to display for this patient.   Past Surgical History:  Procedure Laterality Date  . ABDOMINAL HYSTERECTOMY  2016  . CHOLECYSTECTOMY    . KIDNEY SURGERY    . partial kidney resection-left      Allergies Patient has no known allergies.  Social History Social History  Substance Use Topics  . Smoking status: Never Smoker  . Smokeless tobacco: Never Used  . Alcohol use No    Review of Systems Constitutional: Negative for fever. Eyes: Negative for vision changes ENT:  Negative for congestion, sore throat Cardiovascular: Negative for chest pain. Respiratory: Negative for shortness of breath. Gastrointestinal: Positive for abdominal pain, vomiting Genitourinary: Negative for dysuria. Musculoskeletal: Positive for low back pain Skin: Negative for rash. Neurological: Negative for headaches, focal weakness or numbness.  All systems negative/normal/unremarkable except as stated in the HPI  ____________________________________________   PHYSICAL EXAM:  VITAL SIGNS: ED Triage Vitals  Enc Vitals Group     BP 05/01/17 0807 (!) 140/96     Pulse Rate 05/01/17 0807 (!) 105     Resp 05/01/17  0807 16     Temp 05/01/17 0807 99.6 F (37.6 C)     Temp Source 05/01/17 0807 Oral     SpO2 05/01/17 0807 95 %     Weight 05/01/17 0807 240 lb (108.9 kg)     Height 05/01/17 0807 5\' 5"  (1.651 m)     Head Circumference --      Peak Flow --      Pain Score 05/01/17 0806 10     Pain Loc --      Pain Edu? --      Excl. in [place]? --     Constitutional: Alert and oriented. Mild distress Eyes: Conjunctivae are normal. PERRL. Normal extraocular movements. ENT   Head: Normocephalic and atraumatic.   Nose: No congestion/rhinnorhea.   Mouth/Throat: Mucous membranes are moist.   Neck: No stridor. Cardiovascular: Normal rate, regular rhythm. No murmurs, rubs, or gallops. Respiratory: Normal respiratory effort without tachypnea nor retractions. Breath sounds are clear and equal bilaterally. No wheezes/rales/rhonchi. Gastrointestinal: Soft and nontender. Hyperactive bowel sounds Musculoskeletal: Nontender with normal range of motion in extremities. No lower extremity tenderness nor edema. Neurologic:  Normal speech and language. No gross focal neurologic deficits are appreciated.  Skin:  Skin is warm, dry and intact. No rash noted. Psychiatric: Mood and affect are normal. Speech and behavior are normal.  ____________________________________________  ED COURSE:  Pertinent labs & imaging results that were available during my care of the patient were reviewed by me and considered in my medical decision making (see chart for details). Patient presents for abdominal pain and  vomiting, we will assess with labs and imaging as indicated.   Procedures ____________________________________________   LABS (pertinent positives/negatives)  Labs Reviewed  COMPREHENSIVE METABOLIC PANEL - Abnormal; Notable for the following:       Result Value   Chloride 115 (*)    CO2 18 (*)    Glucose, Bld 150 (*)    Creatinine, Ser 1.11 (*)    Alkaline Phosphatase 131 (*)    GFR calc non Af Amer 59 (*)     All other components within normal limits  CBC - Abnormal; Notable for the following:    WBC 24.1 (*)    RBC 6.07 (*)    Hemoglobin 11.0 (*)    MCV 58.8 (*)    MCH 18.1 (*)    MCHC 30.7 (*)    RDW 19.1 (*)    All other components within normal limits  URINALYSIS, COMPLETE (UACMP) WITH MICROSCOPIC - Abnormal; Notable for the following:    Color, Urine STRAW (*)    APPearance CLEAR (*)    Specific Gravity, Urine 1.004 (*)    Hgb urine dipstick SMALL (*)    Squamous Epithelial / LPF 0-5 (*)    All other components within normal limits  LIPASE, BLOOD    RADIOLOGY Images were viewed by me  CT abdomen and pelvis with contrast IMPRESSION: A cause for patient's symptoms has not been established with this study.  No renal or ureteral calculus. No hydronephrosis evident. Appendix region appears normal. No bowel obstruction or abscess.  Small hiatal hernia. Small ventral hernia containing only fat.  Gallbladder absent. Uterus absent. ____________________________________________  FINAL ASSESSMENT AND PLAN  Abdominal pain,Vomiting, leukocytosis  Plan: Patient's labs and imaging were dictated above. Patient had presented for Abdominal pain and vomiting of uncertain etiology. Her white blood cell count has improved some but not significantly. She still has abdominal cramping and nausea with seemingly intractable vomiting. CT scan was reassuring but I will discuss with the hospitalist for observation.   Earleen Newport, MD   Note: This note was generated in part or whole with voice recognition software. Voice recognition is usually quite accurate but there are transcription errors that can and very often do occur. I apologize for any typographical errors that were not detected and corrected.     Earleen Newport, MD 05/01/17 1345

## 2017-05-01 NOTE — ED Notes (Signed)
Pt moved to subwait so she can lay back.

## 2017-05-01 NOTE — ED Notes (Signed)
PO challenge success.  Patient tolerating fluids well at this time.

## 2017-05-01 NOTE — Consult Note (Signed)
[name] MD  [address] [place], [place] [address] Phone: [phone] Fax : [phone]  Consultation  Referring Provider:    Dr Posey Pronto  Primary Care Physician:  Punger, Barron Schmid, MD Primary Gastroenterologist:  None  Reason for Consultation:     Vomiting   Date of Admission:  05/01/2017 Date of Consultation:  05/01/2017         HPI:   Natasha Kim is a 46 y.o. female presented to the Er today with nausea and vomiting since last night . Some fevers but no diarrhea.  She says she was doing well till last evening when all of a sudden felt a rumble in her abdomen had to pull over while driving , had a bowel movement went home and subsequently threw up > 15 times, no diarrhea, felt she had the chills but no fever, since this am just had 2 episodes of emesis, able to keep down liquids now . No abdominal pains. She carries a diagnosis of thalassemia.    Past Medical History:  Diagnosis Date  . Cancer ([place])    kidney  . Depression   . Migraine thalassemia  . Thalassemia     Past Surgical History:  Procedure Laterality Date  . ABDOMINAL HYSTERECTOMY  2016  . CHOLECYSTECTOMY    . KIDNEY SURGERY    . partial kidney resection-left      Prior to Admission medications   Medication Sig Start Date End Date Taking? Authorizing Provider  benztropine (COGENTIN) 0.5 MG tablet Take 0.5 mg by mouth every 12 (twelve) hours.   Yes [provider]  clonazePAM (KLONOPIN) 0.5 MG tablet Take 0.5-1 mg by mouth every 8 (eight) hours as needed.    Yes [provider]  diclofenac (VOLTAREN) 75 MG EC tablet Take 75 mg by mouth daily. Daily, after lunch   Yes [provider]  estradiol (ESTRACE) 1 MG tablet Take 1 mg by mouth daily.   Yes [provider]  lamoTRIgine (LAMICTAL) 100 MG tablet Take 200 mg by mouth daily.    Yes [provider]  lithium carbonate (LITHOBID) 300 MG CR tablet Take 600 mg by mouth at bedtime.    Yes [provider]    ondansetron (ZOFRAN ODT) 4 MG disintegrating tablet Take 1 tablet (4 mg total) by mouth every 8 (eight) hours as needed for nausea or vomiting. 02/27/14  Yes Kandra Nicolas, MD  sertraline (ZOLOFT) 100 MG tablet Take 100 mg by mouth daily.   Yes [provider]  SUMAtriptan (IMITREX) 50 MG tablet Take 50 mg by mouth every 2 (two) hours as needed for migraine. May repeat in 2 hours if headache persists or recurs.   Yes [provider]  topiramate (TOPAMAX) 200 MG tablet Take 400 mg by mouth at bedtime.   Yes [provider]  zolpidem (AMBIEN) 5 MG tablet Take 10 mg by mouth at bedtime. 02/26/17  Yes [provider]    Family History  Problem Relation Age of Onset  . Migraines Mother   . Diabetes Father   . Cancer Father   . Migraines Sister      Social History  Substance Use Topics  . Smoking status: Never Smoker  . Smokeless tobacco: Never Used  . Alcohol use No    Allergies as of 05/01/2017  . (No Known Allergies)    Review of Systems:    All systems reviewed and negative except where noted in HPI.   Physical Exam:  Vital  signs in last 24 hours: Temp:  [97.6 F (36.4 C)-99.6 F (37.6 C)] 97.6 F (36.4 C) (05/09 1555) Pulse Rate:  [70-105] 86 (05/09 1555) Resp:  [10-20] 20 (05/09 1555) BP: (115-140)/(68-109) 126/81 (05/09 1555) SpO2:  [95 %-99 %] 97 % (05/09 1555) Weight:  [240 lb (108.9 kg)] 240 lb (108.9 kg) (05/09 0807)   General:   Pleasant, cooperative in NAD Head:  Normocephalic and atraumatic. Eyes:   No icterus.   Conjunctiva pink. PERRLA. Ears:  Normal auditory acuity. Neck:  Supple; no masses or thyroidomegaly Lungs: Respirations even and unlabored. Lungs clear to auscultation bilaterally.   No wheezes, crackles, or rhonchi.  Heart:  Regular rate and rhythm;  Without murmur, clicks, rubs or gallops Abdomen:  Soft, nondistended, nontender. Normal bowel sounds. No appreciable masses or hepatomegaly.  No rebound or guarding.   Rectal:  Not performed. Neurologic:  Alert and oriented x3;  grossly normal neurologically. Skin:  Intact without significant lesions or rashes. Cervical Nodes:  No significant cervical adenopathy. Psych:  Alert and cooperative. Normal affect.  LAB RESULTS:  Recent Labs  05/01/17 0806 05/01/17 1247 05/01/17 1545  WBC 24.1* 20.0* 21.7*  HGB 11.0* 10.2* 10.3*  HCT 35.7 33.2* 33.2*  PLT 332 279 293   BMET  Recent Labs  05/01/17 0806 05/01/17 1545  NA 141  --   K 3.7  --   CL 115*  --   CO2 18*  --   GLUCOSE 150*  --   BUN 11  --   CREATININE 1.11* 1.14*  CALCIUM 9.1  --    LFT  Recent Labs  05/01/17 0806  PROT 7.8  ALBUMIN 4.1  AST 26  ALT 30  ALKPHOS 131*  BILITOT 0.5   PT/INR No results for input(s): LABPROT, INR in the last 72 hours.  STUDIES: Ct Abdomen Pelvis W Contrast  Result Date: 05/01/2017 CLINICAL DATA:  Vomiting with abdominal pain and elevated white blood cell count. Previous renal carcinoma. EXAM: CT ABDOMEN AND PELVIS WITH CONTRAST TECHNIQUE: Multidetector CT imaging of the abdomen and pelvis was performed using the standard protocol following bolus administration of intravenous contrast. CONTRAST:  164mL ISOVUE-300 IOPAMIDOL (ISOVUE-300) INJECTION 61% COMPARISON:  October 10, 2007 FINDINGS: Lower chest: There is mild scarring in the left base anteriorly. Lung bases otherwise are clear. There is a small hiatal hernia. Hepatobiliary: No focal liver lesions are evident. Gallbladder is surgically absent. There is no biliary duct dilatation. Pancreas: There is no pancreatic mass or inflammatory focus. Spleen: No splenic lesions are evident. Adrenals/Urinary Tract: The adrenals bilaterally appear unremarkable. There is no demonstrable renal mass or hydronephrosis on either side. There is no appreciable renal or ureteral calculus on either side. Urinary bladder is midline with wall thickness within normal limits. Stomach/Bowel: There is no appreciable bowel  wall or mesenteric thickening. There is no appreciable bowel obstruction. No free air or portal venous air. There is lipomatous infiltration of the ileocecal valve. Vascular/Lymphatic: There is no abdominal aortic aneurysm. No vascular lesion is evident. There is no adenopathy in the abdomen or pelvis. Reproductive: Uterus is absent.  There is no pelvic mass. Other: Appendix appears rather diminutive. No appendiceal or periappendiceal region inflammation apparent. No abscess or ascites evident in the abdomen or pelvis. There is a small ventral hernia containing only fat. Musculoskeletal: There are no blastic or lytic bone lesions. There is no intramuscular or abdominal wall lesion evident. IMPRESSION: A cause for patient's symptoms has not been established with this study.  No renal or ureteral calculus. No hydronephrosis evident. Appendix region appears normal. No bowel obstruction or abscess. Small hiatal hernia.  Small ventral hernia containing only fat. Gallbladder absent.  Uterus absent. Electronically Signed   By: Lowella Grip III M.D.   On: 05/01/2017 11:57      Impression / Plan:   Natasha Kim is a 46 y.o. y/o female with  Nausea and vomiting of 1 day duration . On admission has a lecucocytosis and a microcytic anemia . CT scan of the abdomen is negative for any acute process.   Plan   1. Nausea and vomiting - likely gastroenteritis- conservative management with fluids, antiemetics and analgesia. Wouldn't start any antibiotics for now.   2. Microcytic anemia- check iron studies and if has iron deficiency then will need outpatient EGD+colonoscopy +/- capsule study of the small bowel , celiac serology .   3. Metabolic acidosis- suggest IV fluids and close monitoring , recheck BMP and if still acidotic get an ABG     Thank you for involving me in the care of this patient.      LOS: 0 days   [name], MD  05/01/2017, 5:37 PM

## 2017-05-01 NOTE — H&P (Signed)
[facility] [place] NAME: Natasha Kim    MR#:  [id number]  DATE OF BIRTH:  02/10/1971  DATE OF ADMISSION:  05/01/2017  PRIMARY CARE PHYSICIAN: Punger, Barron Schmid, MD   REQUESTING/REFERRING PHYSICIAN: Lenise Arena MD  CHIEF COMPLAINT:   Chief Complaint  Patient presents with  . Emesis    HISTORY OF PRESENT ILLNESS: Natasha Kim  is a 46 y.o. female with a known history of  Kidney cancer, depression, migraines, thalassemia who is presenting to the emergency room with complaint of having nausea and vomiting since last night. She is also having abdominal cramping. The symptoms started yesterday night. She also complains of having some fevers and chills but no diarrhea. She does not have any hematemesis or hematochezia. In the emergency room she had a CT scan of the abdomen which was negative. Her WBC count is elevated.      PAST MEDICAL HISTORY:   Past Medical History:  Diagnosis Date  . Cancer ([facility])    kidney  . Depression   . Migraine thalassemia  . Thalassemia     PAST SURGICAL HISTORY: Past Surgical History:  Procedure Laterality Date  . ABDOMINAL HYSTERECTOMY  2016  . CHOLECYSTECTOMY    . KIDNEY SURGERY    . partial kidney resection-left      SOCIAL HISTORY:  Social History  Substance Use Topics  . Smoking status: Never Smoker  . Smokeless tobacco: Never Used  . Alcohol use No    FAMILY HISTORY:  Family History  Problem Relation Age of Onset  . Migraines Mother   . Diabetes Father   . Cancer Father   . Migraines Sister     DRUG ALLERGIES: No Known Allergies  REVIEW OF SYSTEMS:   CONSTITUTIONAL: No fever, fatigue or weakness.  EYES: No blurred or double vision.  EARS, NOSE, AND THROAT: No tinnitus or ear pain.  RESPIRATORY: No cough, shortness of breath, wheezing or hemoptysis.  CARDIOVASCULAR: No chest pain, orthopnea, edema.  GASTROINTESTINAL: Positive nausea, vomiting, no diarrhea or positive  abdominal cramping  GENITOURINARY: No dysuria, hematuria.  ENDOCRINE: No polyuria, nocturia,  HEMATOLOGY: No anemia, easy bruising or bleeding SKIN: No rash or lesion. MUSCULOSKELETAL: No joint pain or arthritis.   NEUROLOGIC: No tingling, numbness, weakness.  PSYCHIATRY: No anxiety or depression.   MEDICATIONS AT HOME:  Prior to Admission medications   Medication Sig Start Date End Date Taking? Authorizing Provider  clonazePAM (KLONOPIN) 2 MG tablet Take 2 mg by mouth at bedtime.   Yes [provider]  lamoTRIgine (LAMICTAL) 100 MG tablet Take 200 mg by mouth daily.    Yes [provider]  lithium carbonate (ESKALITH) 450 MG CR tablet Take 900 mg by mouth every morning.   Yes [provider]  sertraline (ZOLOFT) 100 MG tablet Take 100 mg by mouth daily.   Yes [provider]  topiramate (TOPAMAX) 100 MG tablet Take 400 mg by mouth daily.   Yes [provider]  zolpidem (AMBIEN) 5 MG tablet Take 10 mg by mouth at bedtime. 02/26/17  Yes [provider]  HYDROcodone-acetaminophen (NORCO/VICODIN) 5-325 MG per tablet Take 1-2 tablets by mouth every 4 (four) hours as needed for severe pain. Take with food. 09/11/14   Jacqulyn Cane, MD  isometheptene-acetaminophen-dichloralphenazone (MIDRIN) [id number] MG capsule Take 2 caps PO at first sign of headache, then 1 Q4hr prn. Maximum 5 capsules in 12 hours for migraine headaches 02/27/14   Kandra Nicolas, MD  ondansetron (ZOFRAN ODT) 4 MG disintegrating tablet Take 1 tablet (4 mg total) by mouth every 8 (eight) hours as needed for nausea or vomiting. 02/27/14   Kandra Nicolas, MD  promethazine (PHENERGAN) 12.5 MG tablet Take 1 tablet (12.5 mg total) by mouth every 6 (six) hours as needed for nausea or vomiting. 01/16/16   Lavonia Drafts, MD      PHYSICAL EXAMINATION:   VITAL SIGNS: Blood pressure 133/88, pulse 74, temperature 99.6 F (37.6 C), temperature source Oral, resp. rate 16, height 5\' 5"   (1.651 m), weight 240 lb (108.9 kg), last menstrual period 08/28/2014, SpO2 99 %.  GENERAL:  46 y.o.-year-old patient lying in the bed with no acute distress.  EYES: Pupils equal, round, reactive to light and accommodation. No scleral icterus. Extraocular muscles intact.  HEENT: Head atraumatic, normocephalic. Oropharynx and nasopharynx clear.  NECK:  Supple, no jugular venous distention. No thyroid enlargement, no tenderness.  LUNGS: Normal breath sounds bilaterally, no wheezing, rales,rhonchi or crepitation. No use of accessory muscles of respiration.  CARDIOVASCULAR: S1, S2 normal. No murmurs, rubs, or gallops.  ABDOMEN: Soft, nontender, nondistended. Bowel sounds present. No organomegaly or mass.  EXTREMITIES: No pedal edema, cyanosis, or clubbing.  NEUROLOGIC: Cranial nerves II through XII are intact. Muscle strength 5/5 in all extremities. Sensation intact. Gait not checked.  PSYCHIATRIC: The patient is alert and oriented x 3.  SKIN: No obvious rash, lesion, or ulcer.   LABORATORY PANEL:   CBC  Recent Labs Lab 05/01/17 0806 05/01/17 1247  WBC 24.1* 20.0*  HGB 11.0* 10.2*  HCT 35.7 33.2*  PLT 332 279  MCV 58.8* 58.9*  MCH 18.1* 18.1*  MCHC 30.7* 30.7*  RDW 19.1* 19.2*  LYMPHSABS  --  2.0  MONOABS  --  1.3*  EOSABS  --  0.0  BASOSABS  --  0.0   ------------------------------------------------------------------------------------------------------------------  Chemistries   Recent Labs Lab 05/01/17 0806  NA 141  K 3.7  CL 115*  CO2 18*  GLUCOSE 150*  BUN 11  CREATININE 1.11*  CALCIUM 9.1  AST 26  ALT 30  ALKPHOS 131*  BILITOT 0.5   ------------------------------------------------------------------------------------------------------------------ estimated creatinine clearance is 77.8 mL/min (A) (by C-G formula based on SCr of 1.11 mg/dL (H)). ------------------------------------------------------------------------------------------------------------------ No  results for input(s): TSH, T4TOTAL, T3FREE, THYROIDAB in the last 72 hours.  Invalid input(s): FREET3   Coagulation profile No results for input(s): INR, PROTIME in the last 168 hours. ------------------------------------------------------------------------------------------------------------------- No results for input(s): DDIMER in the last 72 hours. -------------------------------------------------------------------------------------------------------------------  Cardiac Enzymes No results for input(s): CKMB, TROPONINI, MYOGLOBIN in the last 168 hours.  Invalid input(s): CK ------------------------------------------------------------------------------------------------------------------ Invalid input(s): POCBNP  ---------------------------------------------------------------------------------------------------------------  Urinalysis    Component Value Date/Time   COLORURINE STRAW (A) 05/01/2017 0806   APPEARANCEUR CLEAR (A) 05/01/2017 0806   LABSPEC 1.004 (L) 05/01/2017 0806   PHURINE 7.0 05/01/2017 0806   GLUCOSEU NEGATIVE 05/01/2017 0806   HGBUR SMALL (A) 05/01/2017 0806   BILIRUBINUR NEGATIVE 05/01/2017 0806   KETONESUR NEGATIVE 05/01/2017 0806   PROTEINUR NEGATIVE 05/01/2017 0806   NITRITE NEGATIVE 05/01/2017 0806   LEUKOCYTESUR NEGATIVE 05/01/2017 0806     RADIOLOGY: Ct Abdomen Pelvis W Contrast  Result Date: 05/01/2017 CLINICAL DATA:  Vomiting with abdominal pain and elevated white blood cell count. Previous renal carcinoma. EXAM: CT ABDOMEN AND PELVIS WITH CONTRAST TECHNIQUE: Multidetector CT imaging of the abdomen and pelvis was performed using the standard protocol following bolus administration of intravenous contrast. CONTRAST:  112mL ISOVUE-300 IOPAMIDOL (ISOVUE-300) INJECTION 61% COMPARISON:  October 10, 2007 FINDINGS: Lower chest: There is mild scarring in the left base anteriorly. Lung bases otherwise are clear. There is a small hiatal hernia. Hepatobiliary:  No focal liver lesions are evident. Gallbladder is surgically absent. There is no biliary duct dilatation. Pancreas: There is no pancreatic mass or inflammatory focus. Spleen: No splenic lesions are evident. Adrenals/Urinary Tract: The adrenals bilaterally appear unremarkable. There is no demonstrable renal mass or hydronephrosis on either side. There is no appreciable renal or ureteral calculus on either side. Urinary bladder is midline with wall thickness within normal limits. Stomach/Bowel: There is no appreciable bowel wall or mesenteric thickening. There is no appreciable bowel obstruction. No free air or portal venous air. There is lipomatous infiltration of the ileocecal valve. Vascular/Lymphatic: There is no abdominal aortic aneurysm. No vascular lesion is evident. There is no adenopathy in the abdomen or pelvis. Reproductive: Uterus is absent.  There is no pelvic mass. Other: Appendix appears rather diminutive. No appendiceal or periappendiceal region inflammation apparent. No abscess or ascites evident in the abdomen or pelvis. There is a small ventral hernia containing only fat. Musculoskeletal: There are no blastic or lytic bone lesions. There is no intramuscular or abdominal wall lesion evident. IMPRESSION: A cause for patient's symptoms has not been established with this study. No renal or ureteral calculus. No hydronephrosis evident. Appendix region appears normal. No bowel obstruction or abscess. Small hiatal hernia.  Small ventral hernia containing only fat. Gallbladder absent.  Uterus absent. Electronically Signed   By: Lowella Grip III M.D.   On: 05/01/2017 11:57    EKG: Orders placed or performed during the hospital encounter of 01/16/16  . ED EKG  . ED EKG  . EKG 12-Lead  . EKG 12-Lead  . EKG    IMPRESSION AND PLAN: Patient is a 46 year old white female presenting with intractable nausea vomiting abdominal cramping  1. Intractable nausea vomiting abdominal cramping suspect  due to gastroenteritis Due to her white blood cell count being elevated I'll as GI to see We'll provide supportive care with IV fluids and antiemetics Patient also on lithium I'll check lithium level  2. Elevated WBC count could be related to stress reaction or gastroenteritis will follow CBC in the morning  3. Depression continue therapy with clonazepam and Zoloft and Lamictal Continue lithium but will check levels  4. Migraines use when necessary pain medications continue Toprol  5. Miscellaneous Lovenox for DVT prophylaxis  All the records are reviewed and case discussed with ED provider. Management plans discussed with the patient, family and they are in agreement.  CODE STATUS: Code Status History    This patient does not have a recorded code status. Please follow your organizational policy for patients in this situation.       TOTAL TIME TAKING CARE OF THIS PATIENT:45 minutes.    Dustin Flock M.D on 05/01/2017 at 2:05 PM  Between 7am to 6pm - Pager - [phone]  After 6pm go to [email or web address] - password [email or web address] [facility]  Office  [phone]  CC: Primary care physician; Punger, Barron Schmid, MD

## 2017-05-01 NOTE — ED Notes (Signed)
Pt ambulatory to bathroom without difficulty.  

## 2017-05-01 NOTE — Progress Notes (Signed)
Pharmacy called regarding lithium carbonate ER was not scanning properly on EMAR; pharmacy stated that this is an on-going problem, that the medicine was correct, but not scanning correctly; order for CR, med reads ER; stated that they "would bring this issue up again with those in charge". Barbaraann Faster, RN 11:23 PM 05/01/2017

## 2017-05-02 DIAGNOSIS — D509 Iron deficiency anemia, unspecified: Secondary | ICD-10-CM | POA: Diagnosis not present

## 2017-05-02 DIAGNOSIS — R112 Nausea with vomiting, unspecified: Secondary | ICD-10-CM | POA: Diagnosis not present

## 2017-05-02 DIAGNOSIS — K625 Hemorrhage of anus and rectum: Secondary | ICD-10-CM | POA: Diagnosis not present

## 2017-05-02 LAB — GASTROINTESTINAL PANEL BY PCR, STOOL (REPLACES STOOL CULTURE)
ASTROVIRUS: NOT DETECTED
Adenovirus F40/41: NOT DETECTED
Campylobacter species: NOT DETECTED
Cryptosporidium: NOT DETECTED
Cyclospora cayetanensis: NOT DETECTED
ENTEROAGGREGATIVE E COLI (EAEC): NOT DETECTED
ENTEROTOXIGENIC E COLI (ETEC): NOT DETECTED
Entamoeba histolytica: NOT DETECTED
Enteropathogenic E coli (EPEC): NOT DETECTED
GIARDIA LAMBLIA: NOT DETECTED
NOROVIRUS GI/GII: NOT DETECTED
Plesimonas shigelloides: NOT DETECTED
Rotavirus A: NOT DETECTED
Salmonella species: NOT DETECTED
Sapovirus (I, II, IV, and V): NOT DETECTED
Shiga like toxin producing E coli (STEC): NOT DETECTED
Shigella/Enteroinvasive E coli (EIEC): NOT DETECTED
VIBRIO CHOLERAE: NOT DETECTED
VIBRIO SPECIES: NOT DETECTED
Yersinia enterocolitica: NOT DETECTED

## 2017-05-02 LAB — BASIC METABOLIC PANEL
Anion gap: 4 — ABNORMAL LOW (ref 5–15)
BUN: 7 mg/dL (ref 6–20)
CHLORIDE: 120 mmol/L — AB (ref 101–111)
CO2: 21 mmol/L — AB (ref 22–32)
Calcium: 8.6 mg/dL — ABNORMAL LOW (ref 8.9–10.3)
Creatinine, Ser: 0.97 mg/dL (ref 0.44–1.00)
GFR calc Af Amer: >60 mL/min (ref >60)
GFR calc non Af Amer: >60 mL/min (ref >60)
GLUCOSE: 105 mg/dL — AB (ref 65–99)
POTASSIUM: 3.6 mmol/L (ref 3.5–5.1)
Sodium: 145 mmol/L (ref 135–145)

## 2017-05-02 LAB — CBC
HEMATOCRIT: 32.5 % — AB (ref 35.0–47.0)
Hemoglobin: 9.9 g/dL — ABNORMAL LOW (ref 12.0–16.0)
MCH: 18.2 pg — AB (ref 26.0–34.0)
MCHC: 30.4 g/dL — AB (ref 32.0–36.0)
MCV: 59.8 fL — AB (ref 80.0–100.0)
Platelets: 244 10*3/uL (ref 150–440)
RBC: 5.43 MIL/uL — ABNORMAL HIGH (ref 3.80–5.20)
RDW: 18.9 % — AB (ref 11.5–14.5)
WBC: 11.8 10*3/uL — ABNORMAL HIGH (ref 3.6–11.0)

## 2017-05-02 LAB — C DIFFICILE QUICK SCREEN W PCR REFLEX
C DIFFICILE (CDIFF) INTERP: NOT DETECTED
C Diff antigen: NEGATIVE
C Diff toxin: NEGATIVE

## 2017-05-02 MED ORDER — PANTOPRAZOLE SODIUM 40 MG PO TBEC
40.0000 mg | DELAYED_RELEASE_TABLET | Freq: Two times a day (BID) | ORAL | Status: DC
  Administered 2017-05-02 – 2017-05-03 (×2): 40 mg via ORAL
  Filled 2017-05-02 (×2): qty 1

## 2017-05-02 NOTE — Care Management Obs Status (Signed)
[facility] NOTIFICATION   Patient Details  Name: SPENSER CONG MRN: [id number] Date of Birth: 06-29-1971   Medicare Observation Status Notification Given:  Yes    Marshell Garfinkel, RN 05/02/2017, 9:29 AM

## 2017-05-02 NOTE — Progress Notes (Signed)
[facility] [place] NAME: Natasha Kim    MR#:  [id number]  DATE OF BIRTH:  1971-01-23  SUBJECTIVE:   Patient here due to intractable nausea and vomiting secondary to a gastroenteritis. Still having some nausea but no vomiting. Now having some diarrhea. No fevers, chills. Tolerating clear liquids well. Having some abdominal cramping.  REVIEW OF SYSTEMS:    Review of Systems  Constitutional: Negative for chills and fever.  HENT: Negative for congestion and tinnitus.   Eyes: Negative for blurred vision and double vision.  Respiratory: Negative for cough, shortness of breath and wheezing.   Cardiovascular: Negative for chest pain, orthopnea and PND.  Gastrointestinal: Positive for abdominal pain (abdominal cramping) and diarrhea. Negative for nausea and vomiting.  Genitourinary: Negative for dysuria and hematuria.  Neurological: Negative for dizziness, sensory change and focal weakness.  All other systems reviewed and are negative.   Nutrition: Clear liquids Tolerating Diet: Yes Tolerating PT: Ambulatory  DRUG ALLERGIES:  No Known Allergies  VITALS:  Blood pressure 122/78, pulse 77, temperature 97.7 F (36.5 C), temperature source Oral, resp. rate 18, height 5\' 5"  (1.651 m), weight 108.9 kg (240 lb), last menstrual period 08/28/2014, SpO2 98 %.  PHYSICAL EXAMINATION:   Physical Exam  GENERAL:  46 y.o.-year-old obese patient lying in bed in no acute distress.  EYES: Pupils equal, round, reactive to light and accommodation. No scleral icterus. Extraocular muscles intact.  HEENT: Head atraumatic, normocephalic. Oropharynx and nasopharynx clear.  NECK:  Supple, no jugular venous distention. No thyroid enlargement, no tenderness.  LUNGS: Normal breath sounds bilaterally, no wheezing, rales, rhonchi. No use of accessory muscles of respiration.  CARDIOVASCULAR: S1, S2 normal. No murmurs, rubs, or gallops.  ABDOMEN: Soft, nontender,  slightly distended. Bowel sounds present. No organomegaly or mass.  EXTREMITIES: No cyanosis, clubbing or edema b/l.    NEUROLOGIC: Cranial nerves II through XII are intact. No focal Motor or sensory deficits b/l.   PSYCHIATRIC: The patient is alert and oriented x 3.  SKIN: No obvious rash, lesion, or ulcer.    LABORATORY PANEL:   CBC  Recent Labs Lab 05/02/17 0429  WBC 11.8*  HGB 9.9*  HCT 32.5*  PLT 244   ------------------------------------------------------------------------------------------------------------------  Chemistries   Recent Labs Lab 05/01/17 0806  05/02/17 0429  NA 141  --  145  K 3.7  --  3.6  CL 115*  --  120*  CO2 18*  --  21*  GLUCOSE 150*  --  105*  BUN 11  --  7  CREATININE 1.11*  < > 0.97  CALCIUM 9.1  --  8.6*  AST 26  --   --   ALT 30  --   --   ALKPHOS 131*  --   --   BILITOT 0.5  --   --   < > = values in this interval not displayed. ------------------------------------------------------------------------------------------------------------------  Cardiac Enzymes No results for input(s): TROPONINI in the last 168 hours. ------------------------------------------------------------------------------------------------------------------  RADIOLOGY:  Ct Abdomen Pelvis W Contrast  Result Date: 05/01/2017 CLINICAL DATA:  Vomiting with abdominal pain and elevated white blood cell count. Previous renal carcinoma. EXAM: CT ABDOMEN AND PELVIS WITH CONTRAST TECHNIQUE: Multidetector CT imaging of the abdomen and pelvis was performed using the standard protocol following bolus administration of intravenous contrast. CONTRAST:  146mL ISOVUE-300 IOPAMIDOL (ISOVUE-300) INJECTION 61% COMPARISON:  October 10, 2007 FINDINGS: Lower chest: There is mild scarring in the left base anteriorly. Lung bases otherwise are  clear. There is a small hiatal hernia. Hepatobiliary: No focal liver lesions are evident. Gallbladder is surgically absent. There is no biliary duct  dilatation. Pancreas: There is no pancreatic mass or inflammatory focus. Spleen: No splenic lesions are evident. Adrenals/Urinary Tract: The adrenals bilaterally appear unremarkable. There is no demonstrable renal mass or hydronephrosis on either side. There is no appreciable renal or ureteral calculus on either side. Urinary bladder is midline with wall thickness within normal limits. Stomach/Bowel: There is no appreciable bowel wall or mesenteric thickening. There is no appreciable bowel obstruction. No free air or portal venous air. There is lipomatous infiltration of the ileocecal valve. Vascular/Lymphatic: There is no abdominal aortic aneurysm. No vascular lesion is evident. There is no adenopathy in the abdomen or pelvis. Reproductive: Uterus is absent.  There is no pelvic mass. Other: Appendix appears rather diminutive. No appendiceal or periappendiceal region inflammation apparent. No abscess or ascites evident in the abdomen or pelvis. There is a small ventral hernia containing only fat. Musculoskeletal: There are no blastic or lytic bone lesions. There is no intramuscular or abdominal wall lesion evident. IMPRESSION: A cause for patient's symptoms has not been established with this study. No renal or ureteral calculus. No hydronephrosis evident. Appendix region appears normal. No bowel obstruction or abscess. Small hiatal hernia.  Small ventral hernia containing only fat. Gallbladder absent.  Uterus absent. Electronically Signed   By: Lowella Grip III M.D.   On: 05/01/2017 11:57     ASSESSMENT AND PLAN:   46 year old female past medical history of depression, migraines, thalassemia presented to the hospital due to intractable nausea vomiting and noted to have gastroenteritis.  1. Nausea vomiting diarrhea-suspected to be secondary to viral gastroenteritis. Nausea vomiting is improved. Still having some diarrhea. -We will check stool for C. difficile and comprehensive PCR. -Continue supportive  care with IV fluids, antiemetics. Appreciate gastroenterology input and no plans for acute intervention would likely benefit from outpatient endoscopy colonoscopy. Tolerating clear liquids and will advance to full liquids today.  2. Diarrhea-check stool for C. difficile, comprehensive culture.  3. Depression/anxiety-continue Klonopin, Lamictal, lithium, Topamax, Zoloft. -Lithium level is low normal.   All the records are reviewed and case discussed with Care Management/Social Worker. Management plans discussed with the patient, family and they are in agreement.  CODE STATUS: Full code  DVT Prophylaxis: Lovenox  TOTAL TIME TAKING CARE OF THIS PATIENT: 30 minutes.   POSSIBLE D/C IN 1-2 DAYS, DEPENDING ON CLINICAL CONDITION.   Henreitta Leber M.D on 05/02/2017 at 1:44 PM  Between 7am to 6pm - Pager - [phone]  After 6pm go to [email or web address] - Technical brewer [facility]  Office  [phone]  CC: Primary care physician; Punger, Barron Schmid, MD

## 2017-05-02 NOTE — Progress Notes (Signed)
Natasha Bellows MD [address], [place], [place] [address] Phone: [phone] Fax : [phone]  Natasha Kim is being followed for gastroenteritis  Day 2 of follow up   Subjective: Has some abdominal cramps, able to keep food down , some diarrhea with blood in stool.    Objective: Vital signs in last 24 hours: Vitals:   05/01/17 1430 05/01/17 1555 05/01/17 2126 05/02/17 0517  BP: (!) 124/94 126/81 (!) 146/84 (!) 123/91  Pulse: 70 86 72 78  Resp: 15 20 20 20   Temp:  97.6 F (36.4 C) 97.6 F (36.4 C) 97.6 F (36.4 C)  TempSrc:  Oral Oral Oral  SpO2: 99% 97% 100% 100%  Weight:      Height:       Weight change:   Intake/Output Summary (Last 24 hours) at 05/02/17 1222 Last data filed at 05/02/17 0900  Gross per 24 hour  Intake          3693.33 ml  Output                0 ml  Net          3693.33 ml     Exam: Heart:: Regular rate and rhythm, S1S2 present or without murmur or extra heart sounds Lungs: normal, clear to auscultation and clear to auscultation and percussion Abdomen: soft, nontender, normal bowel sounds   Lab Results: @LABTEST2 @ Micro Results: No results found for this or any previous visit (from the past 240 hour(s)). Studies/Results: Ct Abdomen Pelvis W Contrast  Result Date: 05/01/2017 CLINICAL DATA:  Vomiting with abdominal pain and elevated white blood cell count. Previous renal carcinoma. EXAM: CT ABDOMEN AND PELVIS WITH CONTRAST TECHNIQUE: Multidetector CT imaging of the abdomen and pelvis was performed using the standard protocol following bolus administration of intravenous contrast. CONTRAST:  18mL ISOVUE-300 IOPAMIDOL (ISOVUE-300) INJECTION 61% COMPARISON:  October 10, 2007 FINDINGS: Lower chest: There is mild scarring in the left base anteriorly. Lung bases otherwise are clear. There is a small hiatal hernia. Hepatobiliary: No focal liver lesions are evident. Gallbladder is surgically absent. There is no biliary duct dilatation.  Pancreas: There is no pancreatic mass or inflammatory focus. Spleen: No splenic lesions are evident. Adrenals/Urinary Tract: The adrenals bilaterally appear unremarkable. There is no demonstrable renal mass or hydronephrosis on either side. There is no appreciable renal or ureteral calculus on either side. Urinary bladder is midline with wall thickness within normal limits. Stomach/Bowel: There is no appreciable bowel wall or mesenteric thickening. There is no appreciable bowel obstruction. No free air or portal venous air. There is lipomatous infiltration of the ileocecal valve. Vascular/Lymphatic: There is no abdominal aortic aneurysm. No vascular lesion is evident. There is no adenopathy in the abdomen or pelvis. Reproductive: Uterus is absent.  There is no pelvic mass. Other: Appendix appears rather diminutive. No appendiceal or periappendiceal region inflammation apparent. No abscess or ascites evident in the abdomen or pelvis. There is a small ventral hernia containing only fat. Musculoskeletal: There are no blastic or lytic bone lesions. There is no intramuscular or abdominal wall lesion evident. IMPRESSION: A cause for patient's symptoms has not been established with this study. No renal or ureteral calculus. No hydronephrosis evident. Appendix region appears normal. No bowel obstruction or abscess. Small hiatal hernia.  Small ventral hernia containing only fat. Gallbladder absent.  Uterus absent. Electronically Signed   By: Lowella Grip III M.D.   On: 05/01/2017 11:57   Medications: I have reviewed the patient's current  medications. Scheduled Meds: . Cariprazine HCl  1.5 mg Oral QHS  . clonazePAM  2 mg Oral QHS  . enoxaparin (LOVENOX) injection  40 mg Subcutaneous Q24H  . lamoTRIgine  200 mg Oral Daily  . lithium carbonate  600 mg Oral QPM  . pantoprazole (PROTONIX) IV  40 mg Intravenous Q12H  . sertraline  100 mg Oral Daily  . topiramate  400 mg Oral Daily  . zolpidem  5 mg Oral QHS    Continuous Infusions: . sodium chloride 100 mL/hr at 05/01/17 1658   PRN Meds:.acetaminophen **OR** acetaminophen, HYDROcodone-acetaminophen, ondansetron **OR** ondansetron (ZOFRAN) IV, promethazine   Assessment: Active Problems:   Nausea and vomiting  Natasha Kim is a 46 y.o. y/o female with  Nausea and vomiting of 1 day duration . On admission had a lecucocytosis and a microcytic anemia .Ferritin 8 suggesting of iron deficiency in addition to thalassemia history which she has . Leucocytosis has significantly improved today .  CT scan of the abdomen is negative for any acute process.   Plan   1. Nausea and vomiting -  Resolved- advance diet   2. Microcytic iron deficiency anemia- Commence on oral iron and will need outpatient EGD+colonoscopy +/- capsule study of the small bowel , celiac serology . She also has some blood in stool which she attributes to hemorroids which will be evaluated during endoscopy    I will sign off.  Please call me if any further GI concerns or questions.  We would like to thank you for the opportunity to participate in the care of Natasha Kim.   LOS: 0 days   Natasha Kim 05/02/2017, 12:22 PM

## 2017-05-03 NOTE — Discharge Summary (Signed)
[facility] [place] NAME: Natasha Kim    MR#:  [id number]  DATE OF BIRTH:  [birth year]  DATE OF ADMISSION:  05/01/2017 ADMITTING PHYSICIAN: Dustin Flock, MD  DATE OF DISCHARGE: 05/03/2017  1:29 PM  PRIMARY CARE PHYSICIAN: Punger, Barron Schmid, MD    ADMISSION DIAGNOSIS:  Generalized abdominal pain [R10.84] Intractable vomiting with nausea, unspecified vomiting type [R11.2]  DISCHARGE DIAGNOSIS:  Active Problems:   Nausea and vomiting   SECONDARY DIAGNOSIS:   Past Medical History:  Diagnosis Date  . Cancer ([place])    kidney  . Depression   . Migraine thalassemia  . Thalassemia     HOSPITAL COURSE:   46 year old female past medical history of depression, migraines, thalassemia presented to the hospital due to intractable nausea vomiting and noted to have gastroenteritis.  1. Nausea vomiting diarrhea-secondary to viral gastroenteritis.  -Patient was treated with supportive care with IV fluids, antiemetics. She has clinically improved and no longer has any further nausea vomiting or diarrhea. Her stool for Clostridium difficile and comprehensive culture was negative. -Patient was seen by gastroenterology who recommended outpatient endoscopic evaluation. She is now presently being discharged with follow-up with gastroenterology as an outpatient.  2. Diarrhea- due to # 1 and now resolved.  - Stool for C. Dif and comp. Culture was (-).  Cont. Imodium PRN as outpatient.   3. Depression/anxiety-continue Klonopin, Lamictal, lithium, Topamax, Zoloft. -Lithium level was low normal.  DISCHARGE CONDITIONS:   Stable  CONSULTS OBTAINED:    DRUG ALLERGIES:  No Known Allergies  DISCHARGE MEDICATIONS:   Allergies as of 05/03/2017   No Known Allergies     Medication List    TAKE these medications   benztropine 0.5 MG tablet Commonly known as:  COGENTIN Take 0.5 mg by mouth every 12 (twelve) hours.   Cariprazine HCl 1.5 MG  Caps Take 1.5 mg by mouth daily at 10 pm.   clonazePAM 0.5 MG tablet Commonly known as:  KLONOPIN Take 0.5-1 mg by mouth every 8 (eight) hours as needed.   diclofenac 75 MG EC tablet Commonly known as:  VOLTAREN Take 75 mg by mouth daily. Daily, after lunch   estradiol 1 MG tablet Commonly known as:  ESTRACE Take 1 mg by mouth daily.   lamoTRIgine 100 MG tablet Commonly known as:  LAMICTAL Take 200 mg by mouth daily.   lithium carbonate 300 MG CR tablet Commonly known as:  LITHOBID Take 600 mg by mouth at bedtime.   ondansetron 4 MG disintegrating tablet Commonly known as:  ZOFRAN ODT Take 1 tablet (4 mg total) by mouth every 8 (eight) hours as needed for nausea or vomiting.   sertraline 100 MG tablet Commonly known as:  ZOLOFT Take 100 mg by mouth daily.   SUMAtriptan 50 MG tablet Commonly known as:  IMITREX Take 50 mg by mouth every 2 (two) hours as needed for migraine. May repeat in 2 hours if headache persists or recurs.   topiramate 200 MG tablet Commonly known as:  TOPAMAX Take 400 mg by mouth at bedtime. Notes to patient:  Last dose given 05/03/17 at 9:30am   zolpidem 5 MG tablet Commonly known as:  AMBIEN Take 10 mg by mouth at bedtime.         DISCHARGE INSTRUCTIONS:   DIET:  Regular diet  DISCHARGE CONDITION:  Stable  ACTIVITY:  Activity as tolerated  OXYGEN:  Home Oxygen: No.   Oxygen Delivery: room air  DISCHARGE LOCATION:  home   If you experience worsening of your admission symptoms, develop shortness of breath, life threatening emergency, suicidal or homicidal thoughts you must seek medical attention immediately by calling 911 or calling your MD immediately  if symptoms less severe.  You Must read complete instructions/literature along with all the possible adverse reactions/side effects for all the Medicines you take and that have been prescribed to you. Take any new Medicines after you have completely understood and accpet all the  possible adverse reactions/side effects.   Please note  You were cared for by a hospitalist during your hospital stay. If you have any questions about your discharge medications or the care you received while you were in the hospital after you are discharged, you can call the unit and asked to speak with the hospitalist on call if the hospitalist that took care of you is not available. Once you are discharged, your primary care physician will handle any further medical issues. Please note that NO REFILLS for any discharge medications will be authorized once you are discharged, as it is imperative that you return to your primary care physician (or establish a relationship with a primary care physician if you do not have one) for your aftercare needs so that they can reassess your need for medications and monitor your lab values.     Today   No abdominal pain, no further N/V or diarrhea.   VITAL SIGNS:  Blood pressure 112/74, pulse 76, temperature 97.7 F (36.5 C), temperature source Oral, resp. rate 16, height 5\' 5"  (1.651 m), weight 108.9 kg (240 lb), last menstrual period 08/28/2014, SpO2 98 %.  I/O:   Intake/Output Summary (Last 24 hours) at 05/03/17 1429 Last data filed at 05/03/17 0600  Gross per 24 hour  Intake             1820 ml  Output                0 ml  Net             1820 ml    PHYSICAL EXAMINATION:  GENERAL:  46 y.o.-year-old obese patient lying in bed in no acute distress.  EYES: Pupils equal, round, reactive to light and accommodation. No scleral icterus. Extraocular muscles intact.  HEENT: Head atraumatic, normocephalic. Oropharynx and nasopharynx clear.  NECK:  Supple, no jugular venous distention. No thyroid enlargement, no tenderness.  LUNGS: Normal breath sounds bilaterally, no wheezing, rales,rhonchi. No use of accessory muscles of respiration.  CARDIOVASCULAR: S1, S2 normal. No murmurs, rubs, or gallops.  ABDOMEN: Soft, non-tender, non-distended. Bowel sounds  present. No organomegaly or mass.  EXTREMITIES: No pedal edema, cyanosis, or clubbing.  NEUROLOGIC: Cranial nerves II through XII are intact. No focal motor or sensory defecits b/l.  PSYCHIATRIC: The patient is alert and oriented x 3.   SKIN: No obvious rash, lesion, or ulcer.   DATA REVIEW:   CBC  Recent Labs Lab 05/02/17 0429  WBC 11.8*  HGB 9.9*  HCT 32.5*  PLT 244    Chemistries   Recent Labs Lab 05/01/17 0806  05/02/17 0429  NA 141  --  145  K 3.7  --  3.6  CL 115*  --  120*  CO2 18*  --  21*  GLUCOSE 150*  --  105*  BUN 11  --  7  CREATININE 1.11*  < > 0.97  CALCIUM 9.1  --  8.6*  AST 26  --   --   ALT 30  --   --  ALKPHOS 131*  --   --   BILITOT 0.5  --   --   < > = values in this interval not displayed.  Cardiac Enzymes No results for input(s): TROPONINI in the last 168 hours.  Microbiology Results  Results for orders placed or performed during the hospital encounter of 05/01/17  Gastrointestinal Panel by PCR , Stool     Status: None   Collection Time: 05/02/17  3:59 PM  Result Value Ref Range Status   Campylobacter species NOT DETECTED NOT DETECTED Final   Plesimonas shigelloides NOT DETECTED NOT DETECTED Final   Salmonella species NOT DETECTED NOT DETECTED Final   Yersinia enterocolitica NOT DETECTED NOT DETECTED Final   Vibrio species NOT DETECTED NOT DETECTED Final   Vibrio cholerae NOT DETECTED NOT DETECTED Final   Enteroaggregative E coli (EAEC) NOT DETECTED NOT DETECTED Final   Enteropathogenic E coli (EPEC) NOT DETECTED NOT DETECTED Final   Enterotoxigenic E coli (ETEC) NOT DETECTED NOT DETECTED Final   Shiga like toxin producing E coli (STEC) NOT DETECTED NOT DETECTED Final   Shigella/Enteroinvasive E coli (EIEC) NOT DETECTED NOT DETECTED Final   Cryptosporidium NOT DETECTED NOT DETECTED Final   Cyclospora cayetanensis NOT DETECTED NOT DETECTED Final   Entamoeba histolytica NOT DETECTED NOT DETECTED Final   Giardia lamblia NOT DETECTED  NOT DETECTED Final   Adenovirus F40/41 NOT DETECTED NOT DETECTED Final   Astrovirus NOT DETECTED NOT DETECTED Final   Norovirus GI/GII NOT DETECTED NOT DETECTED Final   Rotavirus A NOT DETECTED NOT DETECTED Final   Sapovirus (I, II, IV, and V) NOT DETECTED NOT DETECTED Final  C difficile quick scan w PCR reflex     Status: None   Collection Time: 05/02/17  9:25 PM  Result Value Ref Range Status   C Diff antigen NEGATIVE NEGATIVE Final   C Diff toxin NEGATIVE NEGATIVE Final   C Diff interpretation No C. difficile detected.  Final    Comment: VALID    RADIOLOGY:  No results found.    Management plans discussed with the patient, family and they are in agreement.  CODE STATUS:     Code Status Orders        Start     Ordered   05/01/17 1601  Full code  Continuous     05/01/17 1600    Code Status History    Date Active Date Inactive Code Status Order ID Comments User Context   This patient has a current code status but no historical code status.      TOTAL TIME TAKING CARE OF THIS PATIENT: 40 minutes.    Henreitta Leber M.D on 05/03/2017 at 2:29 PM  Between 7am to 6pm - Pager - [phone]  After 6pm go to [email or web address] - [employer] [facility] [place] [facility]  Office  [phone]  CC: Primary care physician; Punger, Barron Schmid, MD

## 2017-05-03 NOTE — Plan of Care (Signed)
Problem: Bowel/Gastric: Goal: Will not experience complications related to bowel motility Outcome: Progressing Negative for C. Diff; tolerating full liquid diet. Diarrhea lessening.

## 2017-05-03 NOTE — Progress Notes (Signed)
Patient discharged to home as ordered. Follow up appointment given as ordered. IV discontinued to forearm site clean dry and intact. Patient is alert and oriented, ambulates without assistance, no complaints of pain voiced.

## 2018-01-20 IMAGING — CT CT ABD-PELV W/ CM
2 of 5 series · 15 of 46 positions shown, 17 images · IV contrast (APPLIED)
Comparison: October 10, 2007

CLINICAL DATA: Vomiting with abdominal pain and elevated white
blood cell count. Previous renal carcinoma.

EXAM:
CT ABDOMEN AND PELVIS WITH CONTRAST
TECHNIQUE: Multidetector CT imaging of the abdomen and pelvis was performed
using the standard protocol following bolus administration of
intravenous contrast.
CONTRAST:  100mL [id number] IOPAMIDOL ([id number]) INJECTION 61%

[Series 2: routine abd/pel with · axial · 0.88mm/px · z∈[-526,-46]mm · 12 of 108 slices shown, 14 images]
[im 6/108  soft-tissue]
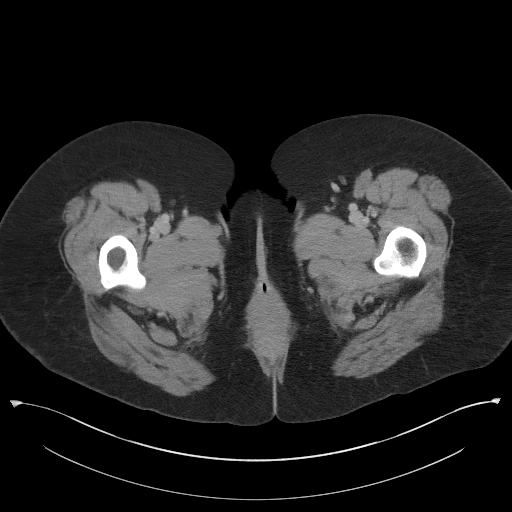
[im 6/108  bone]
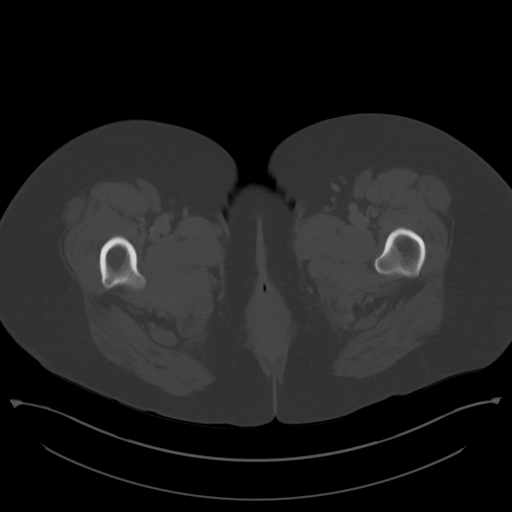
[im 17/108  soft-tissue]
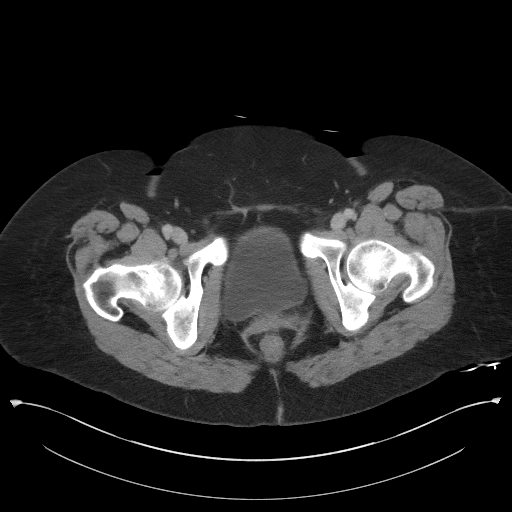
[im 23/108  soft-tissue]
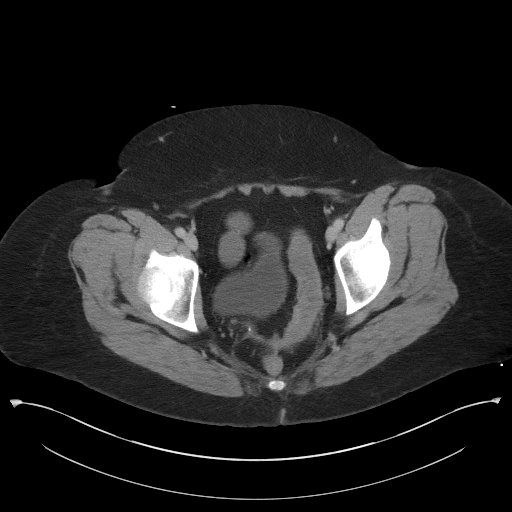
[im 34/108  soft-tissue]
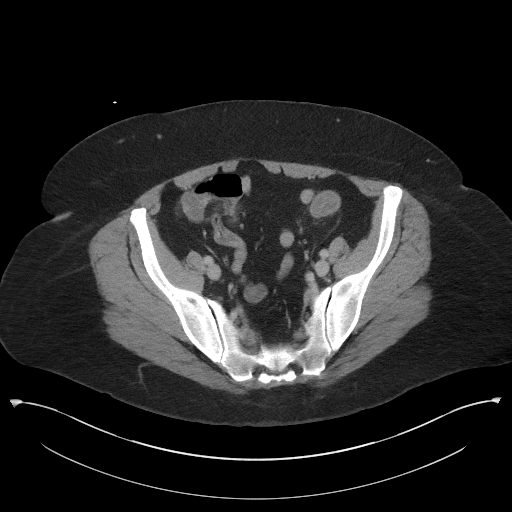
[im 40/108  soft-tissue]
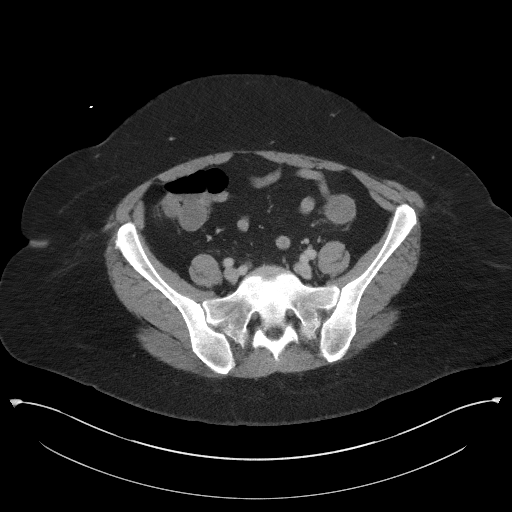
[im 51/108  soft-tissue]
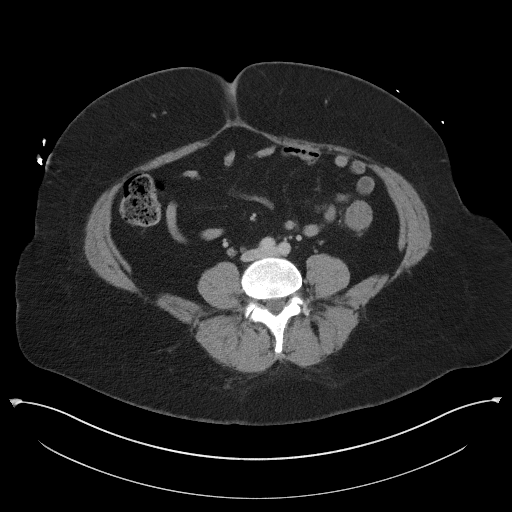
[im 57/108  soft-tissue]
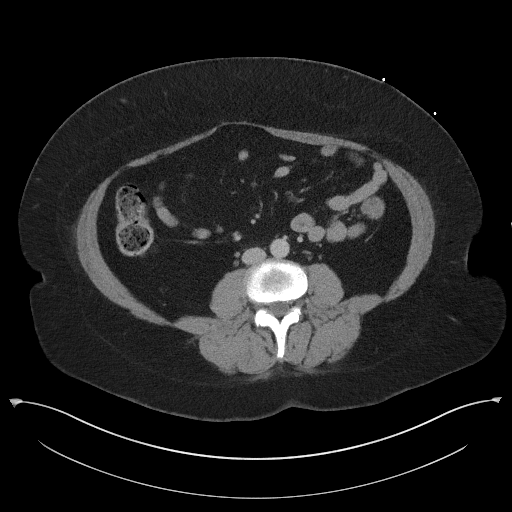
[im 68/108  soft-tissue]
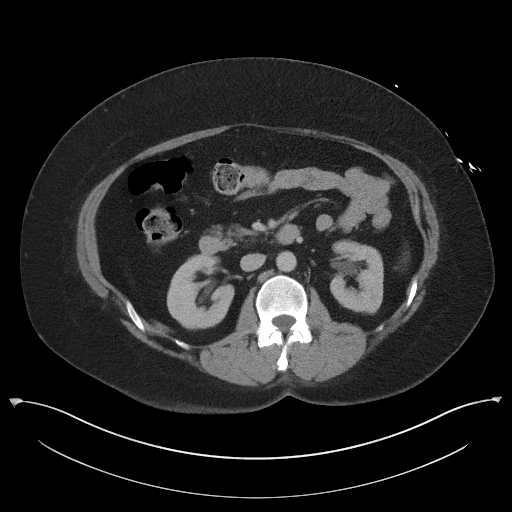
[im 74/108  soft-tissue]
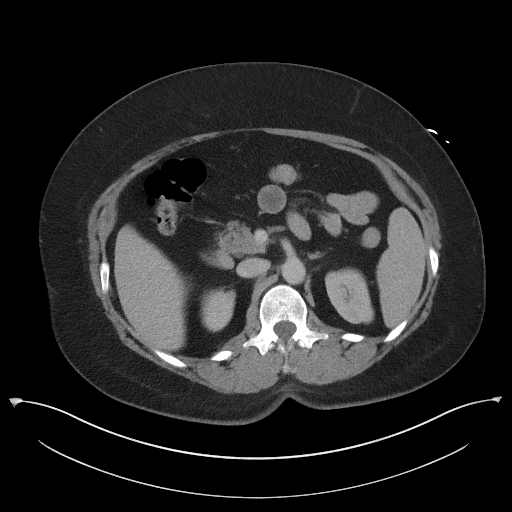
[im 74/108  bone]
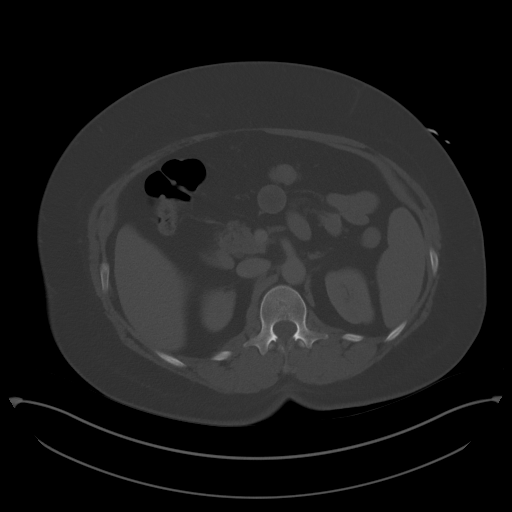
[im 85/108  soft-tissue]
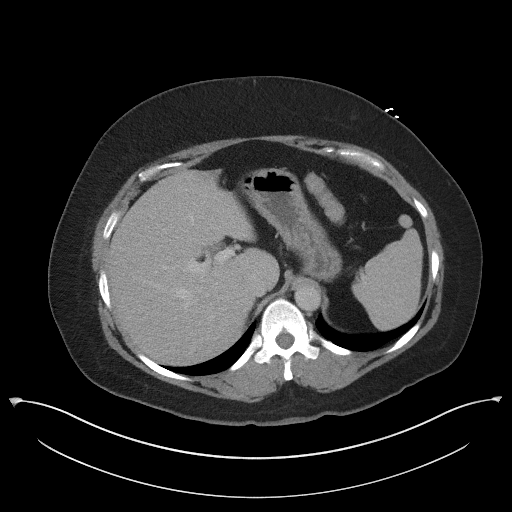
[im 91/108  soft-tissue]
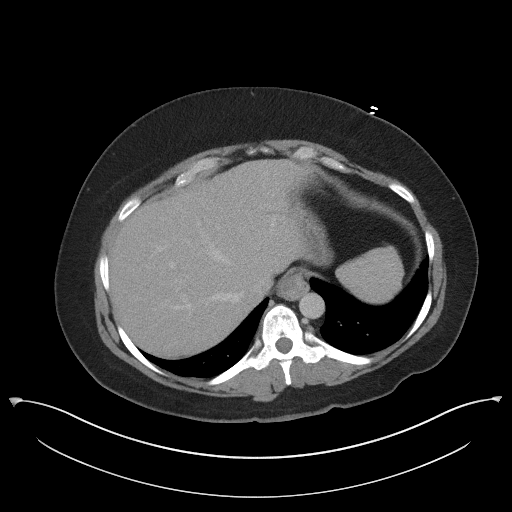
[im 102/108  soft-tissue]
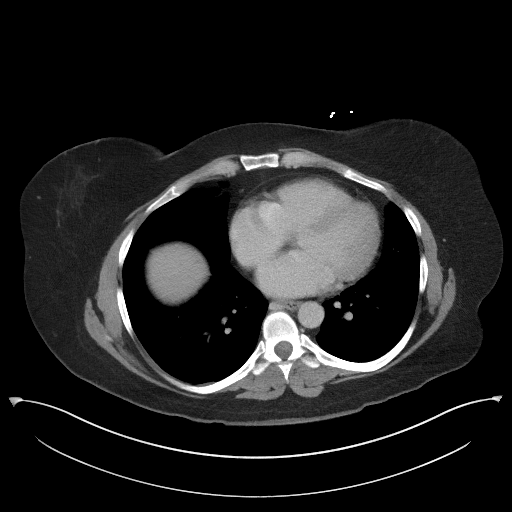

[Series 5: coronal st · coronal · 0.84mm/px · 3 of 89 slices shown]
[im 30/89  soft-tissue]
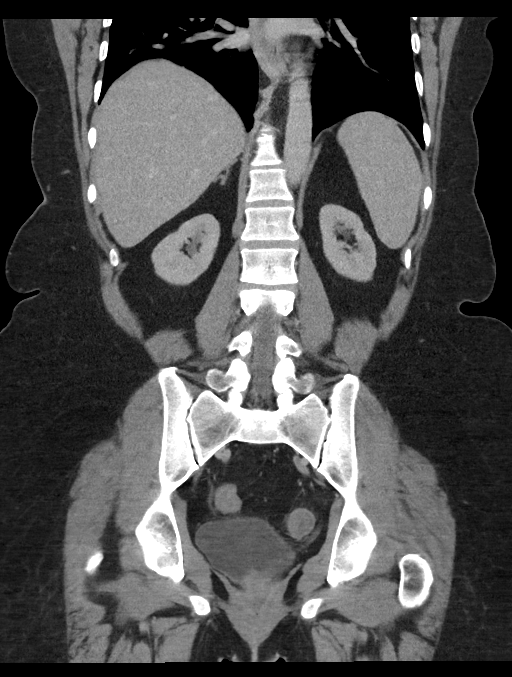
[im 40/89  soft-tissue]
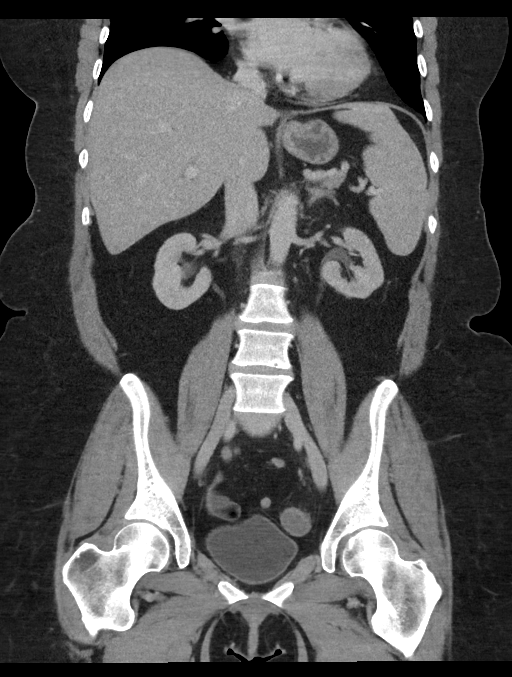
[im 49/89  soft-tissue]
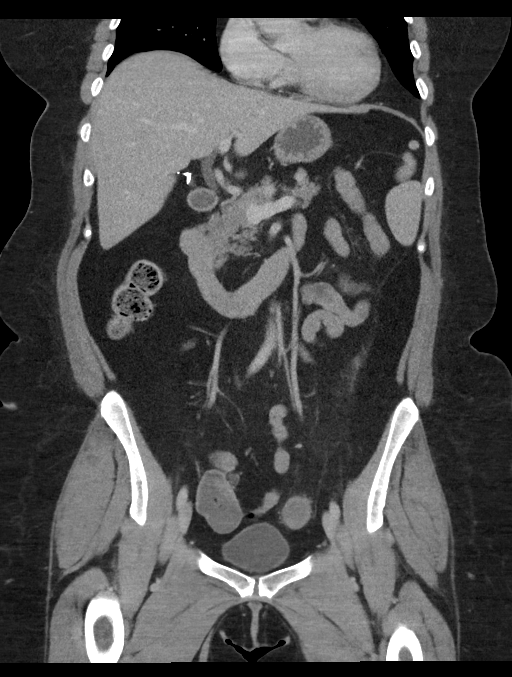

[15 of 46 positions shown; findings below may reference images not displayed]

FINDINGS: Lower chest: There is mild scarring in the left base anteriorly.
Lung bases otherwise are clear. There is a small hiatal hernia.

Hepatobiliary: No focal liver lesions are evident. Gallbladder is
surgically absent. There is no biliary duct dilatation.

Pancreas: There is no pancreatic mass or inflammatory focus.

Spleen: No splenic lesions are evident.

Adrenals/Urinary Tract: The adrenals bilaterally appear
unremarkable. There is no demonstrable renal mass or hydronephrosis
on either side. There is no appreciable renal or ureteral calculus
on either side. Urinary bladder is midline with wall thickness
within normal limits.

Stomach/Bowel: There is no appreciable bowel wall or mesenteric
thickening. There is no appreciable bowel obstruction. No free air
or portal venous air. There is lipomatous infiltration of the
ileocecal valve.

Vascular/Lymphatic: There is no abdominal aortic aneurysm. No
vascular lesion is evident. There is no adenopathy in the abdomen or
pelvis.

Reproductive: Uterus is absent.  There is no pelvic mass.

Other: Appendix appears rather diminutive. No appendiceal or
periappendiceal region inflammation apparent. No abscess or ascites
evident in the abdomen or pelvis. There is a small ventral hernia
containing only fat.

Musculoskeletal: There are no blastic or lytic bone lesions. There
is no intramuscular or abdominal wall lesion evident.
IMPRESSION: A cause for patient's symptoms has not been established with this
study.

No renal or ureteral calculus. No hydronephrosis evident. Appendix
region appears normal. No bowel obstruction or abscess.

Small hiatal hernia.  Small ventral hernia containing only fat.

Gallbladder absent.  Uterus absent.

## 2020-07-21 ENCOUNTER — Ambulatory Visit (INDEPENDENT_AMBULATORY_CARE_PROVIDER_SITE_OTHER): Payer: Medicare Other | Admitting: Family Medicine

## 2020-07-21 ENCOUNTER — Encounter (INDEPENDENT_AMBULATORY_CARE_PROVIDER_SITE_OTHER): Payer: Self-pay | Admitting: Family Medicine

## 2020-07-21 ENCOUNTER — Other Ambulatory Visit: Payer: Self-pay

## 2020-07-21 VITALS — BP 103/73 | HR 64 | Temp 98.1°F | Ht 65.0 in | Wt 266.0 lb

## 2020-07-21 DIAGNOSIS — Z1331 Encounter for screening for depression: Secondary | ICD-10-CM

## 2020-07-21 DIAGNOSIS — E538 Deficiency of other specified B group vitamins: Secondary | ICD-10-CM

## 2020-07-21 DIAGNOSIS — R0602 Shortness of breath: Secondary | ICD-10-CM

## 2020-07-21 DIAGNOSIS — E559 Vitamin D deficiency, unspecified: Secondary | ICD-10-CM

## 2020-07-21 DIAGNOSIS — Z6841 Body Mass Index (BMI) 40.0 and over, adult: Secondary | ICD-10-CM | POA: Diagnosis not present

## 2020-07-21 DIAGNOSIS — R5383 Other fatigue: Secondary | ICD-10-CM

## 2020-07-21 DIAGNOSIS — R739 Hyperglycemia, unspecified: Secondary | ICD-10-CM

## 2020-07-21 DIAGNOSIS — Z0289 Encounter for other administrative examinations: Secondary | ICD-10-CM

## 2020-07-21 NOTE — Progress Notes (Signed)
Chief Complaint:   OBESITY Natasha Kim (MR# [id number]) is a 49 y.o. female who presents for evaluation and treatment of obesity and related comorbidities. Current BMI is Body mass index is 44.26 kg/m. Natasha Kim has been struggling with her weight for many years and has been unsuccessful in either losing weight, maintaining weight loss, or reaching her healthy weight goal.  Natasha Kim has a history depression and anxiety with bipolar. She is on medications which may be contributing to her weight gain.  Natasha Kim is currently in the action stage of change and ready to dedicate time achieving and maintaining a healthier weight. Natasha Kim is interested in becoming our patient and working on intensive lifestyle modifications including (but not limited to) diet and exercise for weight loss.  Natasha Kim's habits were reviewed today and are as follows: her desired weight loss is 106 lbs, she started gaining weight after being put on antidepressants, her heaviest weight ever was 268 pounds, she has significant food cravings issues, she snacks frequently in the evenings, she wakes up frequently in the middle of the night to eat, she skips meals frequently, she is frequently drinking liquids with calories, she frequently makes poor food choices, she has problems with excessive hunger, she frequently eats larger portions than normal and she struggles with emotional eating.  Depression Screen Natasha Kim's Food and Mood (modified PHQ-9) score was 18.  Depression screen PHQ 2/9 07/21/2020  Decreased Interest 2  Down, Depressed, Hopeless 3  PHQ - 2 Score 5  Altered sleeping 1  Tired, decreased energy 3  Change in appetite 3  Feeling bad or failure about yourself  2  Trouble concentrating 2  Moving slowly or fidgety/restless 2  Suicidal thoughts 0  PHQ-9 Score 18  Difficult doing work/chores Not difficult at all   Subjective:   1. Other fatigue Natasha Kim admits to daytime somnolence and admits to waking up still  tired. Patent has a history of symptoms of daytime fatigue and morning headache. Natasha Kim generally gets 8 hours of sleep per night, and states that she has nightime awakenings. Snoring is present. Apneic episodes are not present. Epworth Sleepiness Score is 11.  2. Shortness of breath on exertion Natasha Kim notes increasing shortness of breath with exercising and seems to be worsening over time with weight gain. She notes getting out of breath sooner with activity than she used to. This has not gotten worse recently. Natasha Kim denies shortness of breath at rest or orthopnea.  3. Vitamin D deficiency Natasha Kim is on OTC Vit D, and she has no recent labs. She notes fatigue.  4. Vitamin B12 deficiency Natasha Kim is on OTC B12, and she has no recent labs. She notes fatigue.  5. Hyperglycemia Natasha Kim has a history of elevated glucose readings, but a normal A1c at [facility] earlier this year.  Assessment/Plan:   1. Other fatigue Natasha Kim does feel that her weight is causing her energy to be lower than it should be. Fatigue may be related to obesity, depression or many other causes. Labs will be ordered, and in the meanwhile, Natasha Kim will focus on self care including making healthy food choices, increasing physical activity and focusing on stress reduction.  - EKG 12-Lead - Comprehensive metabolic panel  2. Shortness of breath on exertion Natasha Kim does feel that she gets out of breath more easily that she used to when she exercises. Natasha Kim's shortness of breath appears to be obesity related and exercise induced. She has agreed to work on weight loss and gradually  increase exercise to treat her exercise induced shortness of breath. Will continue to monitor closely.  3. Vitamin D deficiency Low Vitamin D level contributes to fatigue and are associated with obesity, breast, and colon cancer. We will check labs today. Natasha Kim will follow-up for routine testing of Vitamin D, at least 2-3 times per year to avoid  over-replacement.  4. Vitamin B12 deficiency The diagnosis was reviewed with the patient. We will continue to monitor. We will check labs today, and Natasha Kim will follow up as directed. Orders and follow up as documented in patient record.  - Vitamin B12 - Folate  5. Hyperglycemia Fasting labs will be obtained today, and results with be discussed with [name] in 2 weeks at her follow up visit. In the meanwhile Natasha Kim will start her eating plan and will work on weight loss efforts.  - Comprehensive metabolic panel - Insulin, random  6. Depression screening Natasha Kim had a positive depression screening. Depression is commonly associated with obesity and often results in emotional eating behaviors. We will monitor this closely and work on CBT to help improve the non-hunger eating patterns. Referral to Psychology may be required if no improvement is seen as she continues in our clinic.  7. Class 3 severe obesity with serious comorbidity and body mass index (BMI) of 40.0 to 44.9 in adult, unspecified obesity type (HCC) Natasha Kim is currently in the action stage of change and her goal is to continue with weight loss efforts. I recommend Natasha Kim begin the structured treatment plan as follows:  She has agreed to the Category 3 Plan.  Exercise goals: No exercise has been prescribed for now, while we concentrate on nutritional changes.  Behavioral modification strategies: increasing lean protein intake and no skipping meals.  She was informed of the importance of frequent follow-up visits to maximize her success with intensive lifestyle modifications for her multiple health conditions. She was informed we would discuss her lab results at her next visit unless there is a critical issue that needs to be addressed sooner. Natasha Kim agreed to keep her next visit at the agreed upon time to discuss these results.  Objective:   Blood pressure 103/73, pulse 64, temperature 98.1 F (36.7 C), temperature source Oral,  height 5\' 5"  (1.651 m), weight (!) 266 lb (120.7 kg), last menstrual period 08/28/2014, SpO2 98 %. Body mass index is 44.26 kg/m.  EKG: Normal sinus rhythm, rate 64 BPM.  Indirect Calorimeter completed today shows a VO2 of 309 and a REE of 2153.  Her calculated basal metabolic rate is 6295 thus her basal metabolic rate is better than expected.  General: Cooperative, alert, well developed, in no acute distress. HEENT: Conjunctivae and lids unremarkable. Cardiovascular: Regular rhythm.  Lungs: Normal work of breathing. Neurologic: No focal deficits.   Lab Results  Component Value Date   CREATININE 0.97 05/02/2017   BUN 7 05/02/2017   NA 145 05/02/2017   K 3.6 05/02/2017   CL 120 (H) 05/02/2017   CO2 21 (L) 05/02/2017   Lab Results  Component Value Date   ALT 30 05/01/2017   AST 26 05/01/2017   ALKPHOS 131 (H) 05/01/2017   BILITOT 0.5 05/01/2017   No results found for: HGBA1C No results found for: INSULIN Lab Results  Component Value Date   TSH 3.221 05/01/2017   No results found for: CHOL, HDL, LDLCALC, LDLDIRECT, TRIG, CHOLHDL Lab Results  Component Value Date   WBC 11.8 (H) 05/02/2017   HGB 9.9 (L) 05/02/2017   HCT 32.5 (L)  05/02/2017   MCV 59.8 (L) 05/02/2017   PLT 244 05/02/2017   Lab Results  Component Value Date   IRON 52 05/01/2017   TIBC 465 (H) 05/01/2017   FERRITIN 8 (L) 05/01/2017   Obesity Behavioral Intervention Visit Documentation for Insurance:   Approximately 15 minutes were spent on the discussion below.  ASK: We discussed the diagnosis of obesity with [place] today and Von agreed to give [place] permission to discuss obesity behavioral modification therapy today.  ASSESS: Tearra has the diagnosis of obesity and her BMI today is 44.26. Wen is in the action stage of change.   ADVISE: Nashley was educated on the multiple health risks of obesity as well as the benefit of weight loss to improve her health. She was advised of the need for long  term treatment and the importance of lifestyle modifications to improve her current health and to decrease her risk of future health problems.  AGREE: Multiple dietary modification options and treatment options were discussed and Nakira agreed to follow the recommendations documented in the above note.  ARRANGE: [place] was educated on the importance of frequent visits to treat obesity as outlined per CMS and USPSTF guidelines and agreed to schedule her next follow up appointment today.  Attestation Statements:   Reviewed by clinician on day of visit: allergies, medications, problem list, medical history, surgical history, family history, social history, and previous encounter notes.   I, Trixie Dredge, am acting as transcriptionist for Dennard Nip, MD.  I have reviewed the above documentation for accuracy and completeness, and I agree with the above. - Dennard Nip, MD

## 2020-07-22 ENCOUNTER — Encounter (INDEPENDENT_AMBULATORY_CARE_PROVIDER_SITE_OTHER): Payer: Self-pay | Admitting: Family Medicine

## 2020-07-22 LAB — COMPREHENSIVE METABOLIC PANEL
ALT: 91 IU/L — ABNORMAL HIGH (ref 0–32)
AST: 27 IU/L (ref 0–40)
Albumin/Globulin Ratio: 1.6 (ref 1.2–2.2)
Albumin: 4.4 g/dL (ref 3.8–4.8)
Alkaline Phosphatase: 115 IU/L (ref 48–121)
BUN/Creatinine Ratio: 18 (ref 9–23)
BUN: 22 mg/dL (ref 6–24)
Bilirubin Total: 0.2 mg/dL (ref 0.0–1.2)
CO2: 20 mmol/L (ref 20–29)
Calcium: 9.1 mg/dL (ref 8.7–10.2)
Chloride: 106 mmol/L (ref 96–106)
Creatinine, Ser: 1.24 mg/dL — ABNORMAL HIGH (ref 0.57–1.00)
GFR calc Af Amer: 59 mL/min/{1.73_m2} — ABNORMAL LOW (ref >59)
GFR calc non Af Amer: 51 mL/min/{1.73_m2} — ABNORMAL LOW (ref >59)
Globulin, Total: 2.8 g/dL (ref 1.5–4.5)
Glucose: 83 mg/dL (ref 65–99)
Potassium: 4.6 mmol/L (ref 3.5–5.2)
Sodium: 142 mmol/L (ref 134–144)
Total Protein: 7.2 g/dL (ref 6.0–8.5)

## 2020-07-22 LAB — INSULIN, RANDOM: INSULIN: 28 u[IU]/mL — ABNORMAL HIGH (ref 2.6–24.9)

## 2020-07-22 LAB — VITAMIN B12: Vitamin B-12: 651 pg/mL (ref 232–1245)

## 2020-07-22 LAB — FOLATE: Folate: >20 ng/mL (ref >3.0)

## 2020-07-25 NOTE — Telephone Encounter (Signed)
Please advise 

## 2020-08-04 ENCOUNTER — Ambulatory Visit (INDEPENDENT_AMBULATORY_CARE_PROVIDER_SITE_OTHER): Payer: Self-pay | Admitting: Family Medicine

## 2020-12-08 ENCOUNTER — Other Ambulatory Visit: Payer: Self-pay | Admitting: Surgery

## 2020-12-08 ENCOUNTER — Other Ambulatory Visit (HOSPITAL_COMMUNITY): Payer: Self-pay | Admitting: Surgery

## 2022-08-01 ENCOUNTER — Encounter (INDEPENDENT_AMBULATORY_CARE_PROVIDER_SITE_OTHER): Payer: Self-pay

## 2023-09-09 ENCOUNTER — Other Ambulatory Visit: Payer: Self-pay | Admitting: Neurosurgery

## 2023-09-09 DIAGNOSIS — M5440 Lumbago with sciatica, unspecified side: Secondary | ICD-10-CM

## 2023-09-09 DIAGNOSIS — M4722 Other spondylosis with radiculopathy, cervical region: Secondary | ICD-10-CM

## 2023-09-15 ENCOUNTER — Ambulatory Visit: Payer: Medicare Other

## 2023-09-15 DIAGNOSIS — M5440 Lumbago with sciatica, unspecified side: Secondary | ICD-10-CM

## 2023-09-15 DIAGNOSIS — M4722 Other spondylosis with radiculopathy, cervical region: Secondary | ICD-10-CM
